# Patient Record
Sex: Female | Born: 1955 | Race: Black or African American | Hispanic: No | Marital: Married | Smoking: Current every day smoker
Health system: Southern US, Community
[De-identification: ages and names within clinical notes are randomized; demographics above are authoritative.]

## PROBLEM LIST (undated history)

## (undated) DIAGNOSIS — I10 Essential (primary) hypertension: Secondary | ICD-10-CM

## (undated) DIAGNOSIS — D649 Anemia, unspecified: Secondary | ICD-10-CM

## (undated) DIAGNOSIS — M539 Dorsopathy, unspecified: Secondary | ICD-10-CM

## (undated) DIAGNOSIS — G43909 Migraine, unspecified, not intractable, without status migrainosus: Secondary | ICD-10-CM

## (undated) DIAGNOSIS — E7800 Pure hypercholesterolemia, unspecified: Secondary | ICD-10-CM

## (undated) DIAGNOSIS — Z972 Presence of dental prosthetic device (complete) (partial): Secondary | ICD-10-CM

## (undated) DIAGNOSIS — M5136 Other intervertebral disc degeneration, lumbar region: Secondary | ICD-10-CM

## (undated) DIAGNOSIS — R519 Headache, unspecified: Secondary | ICD-10-CM

## (undated) DIAGNOSIS — R51 Headache: Secondary | ICD-10-CM

## (undated) DIAGNOSIS — M51369 Other intervertebral disc degeneration, lumbar region without mention of lumbar back pain or lower extremity pain: Secondary | ICD-10-CM

## (undated) DIAGNOSIS — M5126 Other intervertebral disc displacement, lumbar region: Secondary | ICD-10-CM

## (undated) DIAGNOSIS — M5030 Other cervical disc degeneration, unspecified cervical region: Secondary | ICD-10-CM

## (undated) HISTORY — PX: ENDOSCOPIC HEMILAMINOTOMY W/ DISCECTOMY LUMBAR: SUR439

## (undated) HISTORY — DX: Other cervical disc degeneration, unspecified cervical region: M50.30

## (undated) HISTORY — DX: Other intervertebral disc displacement, lumbar region: M51.26

## (undated) HISTORY — DX: Other intervertebral disc degeneration, lumbar region without mention of lumbar back pain or lower extremity pain: M51.369

## (undated) HISTORY — DX: Anemia, unspecified: D64.9

## (undated) HISTORY — DX: Migraine, unspecified, not intractable, without status migrainosus: G43.909

## (undated) HISTORY — DX: Other intervertebral disc degeneration, lumbar region: M51.36

---

## 2005-07-18 ENCOUNTER — Emergency Department: Payer: Self-pay | Admitting: Emergency Medicine

## 2005-07-18 ENCOUNTER — Other Ambulatory Visit: Payer: Self-pay

## 2007-09-10 ENCOUNTER — Ambulatory Visit: Payer: Self-pay | Admitting: Pain Medicine

## 2007-09-16 ENCOUNTER — Ambulatory Visit: Payer: Self-pay | Admitting: Pain Medicine

## 2007-09-30 ENCOUNTER — Encounter: Payer: Self-pay | Admitting: Pain Medicine

## 2007-10-06 ENCOUNTER — Ambulatory Visit: Payer: Self-pay | Admitting: Physician Assistant

## 2007-10-09 ENCOUNTER — Encounter: Payer: Self-pay | Admitting: Pain Medicine

## 2008-10-08 HISTORY — PX: ANTERIOR CERVICAL DISCECTOMY: SHX1160

## 2009-09-12 ENCOUNTER — Ambulatory Visit: Payer: Self-pay | Admitting: Family Medicine

## 2010-09-12 ENCOUNTER — Ambulatory Visit: Payer: Self-pay | Admitting: Internal Medicine

## 2010-09-25 ENCOUNTER — Ambulatory Visit: Payer: Self-pay | Admitting: Internal Medicine

## 2011-01-17 ENCOUNTER — Ambulatory Visit: Payer: Self-pay | Admitting: Internal Medicine

## 2011-03-21 ENCOUNTER — Ambulatory Visit: Payer: Self-pay | Admitting: Family Medicine

## 2011-03-31 ENCOUNTER — Ambulatory Visit: Payer: Self-pay | Admitting: Internal Medicine

## 2012-01-09 ENCOUNTER — Ambulatory Visit: Payer: Self-pay | Admitting: Internal Medicine

## 2012-11-03 ENCOUNTER — Ambulatory Visit: Payer: Self-pay

## 2012-11-03 LAB — RAPID INFLUENZA A&B ANTIGENS

## 2012-11-10 ENCOUNTER — Ambulatory Visit: Payer: Self-pay

## 2013-01-13 ENCOUNTER — Ambulatory Visit: Payer: Self-pay | Admitting: Internal Medicine

## 2013-04-07 ENCOUNTER — Ambulatory Visit: Payer: Self-pay | Admitting: Internal Medicine

## 2013-04-08 ENCOUNTER — Ambulatory Visit: Payer: Self-pay | Admitting: Internal Medicine

## 2013-04-08 LAB — CREATININE, SERUM
Creatinine: 1.33 mg/dL — ABNORMAL HIGH (ref 0.60–1.30)
EGFR (African American): 52 — ABNORMAL LOW
EGFR (Non-African Amer.): 45 — ABNORMAL LOW

## 2013-08-10 ENCOUNTER — Ambulatory Visit: Payer: Self-pay | Admitting: Physician Assistant

## 2014-06-06 ENCOUNTER — Ambulatory Visit: Payer: Self-pay | Admitting: Internal Medicine

## 2014-06-17 ENCOUNTER — Ambulatory Visit: Payer: Self-pay | Admitting: Internal Medicine

## 2014-06-29 ENCOUNTER — Ambulatory Visit: Payer: Self-pay | Admitting: Emergency Medicine

## 2014-08-06 ENCOUNTER — Ambulatory Visit: Payer: Self-pay | Admitting: Internal Medicine

## 2014-08-08 HISTORY — PX: BREAST BIOPSY: SHX20

## 2014-08-12 ENCOUNTER — Ambulatory Visit: Payer: Self-pay | Admitting: Internal Medicine

## 2014-10-20 ENCOUNTER — Ambulatory Visit: Payer: Self-pay | Admitting: Family Medicine

## 2015-01-31 LAB — SURGICAL PATHOLOGY

## 2015-03-23 ENCOUNTER — Encounter: Payer: Self-pay | Admitting: Internal Medicine

## 2015-03-23 ENCOUNTER — Other Ambulatory Visit: Payer: Self-pay | Admitting: Internal Medicine

## 2015-03-23 ENCOUNTER — Ambulatory Visit (INDEPENDENT_AMBULATORY_CARE_PROVIDER_SITE_OTHER): Payer: Managed Care, Other (non HMO) | Admitting: Internal Medicine

## 2015-03-23 VITALS — BP 164/84 | HR 72 | Temp 98.5°F | Ht 65.0 in | Wt 187.8 lb

## 2015-03-23 DIAGNOSIS — I8390 Asymptomatic varicose veins of unspecified lower extremity: Secondary | ICD-10-CM | POA: Insufficient documentation

## 2015-03-23 DIAGNOSIS — N6011 Diffuse cystic mastopathy of right breast: Secondary | ICD-10-CM

## 2015-03-23 DIAGNOSIS — D242 Benign neoplasm of left breast: Secondary | ICD-10-CM | POA: Insufficient documentation

## 2015-03-23 DIAGNOSIS — N951 Menopausal and female climacteric states: Secondary | ICD-10-CM | POA: Insufficient documentation

## 2015-03-23 DIAGNOSIS — F17200 Nicotine dependence, unspecified, uncomplicated: Secondary | ICD-10-CM | POA: Insufficient documentation

## 2015-03-23 DIAGNOSIS — K529 Noninfective gastroenteritis and colitis, unspecified: Secondary | ICD-10-CM

## 2015-03-23 DIAGNOSIS — G43009 Migraine without aura, not intractable, without status migrainosus: Secondary | ICD-10-CM | POA: Diagnosis not present

## 2015-03-23 DIAGNOSIS — I10 Essential (primary) hypertension: Secondary | ICD-10-CM | POA: Insufficient documentation

## 2015-03-23 DIAGNOSIS — N6012 Diffuse cystic mastopathy of left breast: Principal | ICD-10-CM

## 2015-03-23 DIAGNOSIS — K219 Gastro-esophageal reflux disease without esophagitis: Secondary | ICD-10-CM | POA: Insufficient documentation

## 2015-03-23 MED ORDER — BUTALBITAL-APAP-CAFFEINE 50-300-40 MG PO CAPS
1.0000 | ORAL_CAPSULE | Freq: Two times a day (BID) | ORAL | Status: DC | PRN
Start: 2015-03-23 — End: 2018-06-17

## 2015-03-23 MED ORDER — TOPIRAMATE 25 MG PO TABS: 50.0000 mg | ORAL_TABLET | Freq: Every day | ORAL | Status: DC

## 2015-03-23 MED ORDER — PROMETHAZINE HCL 25 MG PO TABS: 25.0000 mg | ORAL_TABLET | Freq: Three times a day (TID) | ORAL | Status: DC | PRN

## 2015-03-23 NOTE — Patient Instructions (Signed)
Take tylenol as needed for fever and chills Follow a low fat/bland diet until symtoms resolve Use phenergan as needed for nausea

## 2015-03-23 NOTE — Progress Notes (Signed)
Date:  03/23/2015   Name:  Jacqueline Harmon   DOB:  08-06-1956   MRN:  [id number]   Chief Complaint: Nausea; Emesis; Chills; and Fever   Review of Systems: Emesis  This is a new problem. The current episode started in the past 7 days. The problem occurs 2 to 4 times per day. The problem has been unchanged. The emesis has an appearance of stomach contents. The maximum temperature recorded prior to her arrival was 100.4 - 100.9 F. The fever has been present for 1 to 2 days. Associated symptoms include chills, diarrhea, a fever and headaches. Pertinent negatives include no abdominal pain, chest pain or coughing. She has tried increased fluids and diet change (and bepto bismol and advil) for the symptoms. The treatment provided mild relief.  Fever  This is a new problem. The current episode started in the past 7 days. The maximum temperature noted was 99 to 99.9 F. Associated symptoms include diarrhea, headaches, nausea and vomiting. Pertinent negatives include no abdominal pain, chest pain, coughing or urinary pain.    Review of Systems  Constitutional: Positive for fever, chills, activity change and fatigue. Negative for appetite change.  HENT: Negative for sinus pressure and trouble swallowing.   Respiratory: Negative for cough and shortness of breath.   Cardiovascular: Negative for chest pain.  Gastrointestinal: Positive for nausea, vomiting and diarrhea. Negative for abdominal pain and abdominal distention.  Genitourinary: Negative for dysuria.  Neurological: Positive for headaches. Negative for syncope.    Patient Active Problem List   Diagnosis Date Noted  . Smokes tobacco daily 03/23/2015  . Essential (primary) hypertension 03/23/2015  . Bilateral fibrocystic breast disease 03/23/2015  . Acid reflux 03/23/2015  . Hot flash, menopausal 03/23/2015  . Migraine without aura and responsive to treatment 03/23/2015  . 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 Authorizing Provider  cyclobenzaprine (FLEXERIL) 10 MG tablet Take 1 tablet by mouth at bedtime.   Yes Historical Provider, MD  metoprolol succinate (TOPROL XL) 100 MG 24 hr tablet Take 0.5 tablets by mouth daily. 01/19/15  Yes Historical Provider, MD  Butalbital-APAP-Caffeine 50-300-40 MG CAPS Take 1 capsule by mouth 2 (two) times daily as needed. migraine 03/23/13   Historical Provider, MD  Cholecalciferol (VITAMIN D3) 50000 UNITS CAPS Take 1 capsule by mouth once a week.    Historical Provider, MD  spironolactone (ALDACTONE) 50 MG tablet Take 1 tablet by mouth daily. 01/08/14   Historical Provider, MD  topiramate (TOPAMAX) 25 MG tablet Take 2 tablets by mouth at bedtime. 06/29/14   Historical Provider, MD    Allergies  Allergen Reactions  . Ace Inhibitors Cough  . Levofloxacin     myalgia, constipation    Past Surgical History  Procedure Laterality Date  . Breast biopsy Left     papilloma  . Breast mass excision Left 09/2014    History  Substance Use Topics  . Smoking status: Current Every Day Smoker  . Smokeless tobacco: Not on file  . Alcohol Use: 1.2 oz/week    2 Standard drinks or equivalent per week     Medication list has been reviewed and updated.  Physical Examination:  Physical Exam  Constitutional: She is oriented to person, place, and time. She appears well-developed. She appears lethargic. No distress.  Neck: Neck supple.  Cardiovascular: Normal rate, regular rhythm and normal heart sounds.   Pulmonary/Chest: Effort normal and breath sounds normal. She has no  wheezes. She has no rales.  Abdominal: Soft. Bowel sounds are normal. She exhibits no mass. There is no tenderness.  Lymphadenopathy:    She has no cervical adenopathy.  Neurological: She is oriented to person, place, and time. She appears lethargic.    BP 164/84 mmHg  Pulse 72  Temp(Src) 98.5 F (36.9 C)  Ht 5\' 5"  (1.651 m)  Wt 187 lb 12.8 oz (85.186 kg)   BMI 31.25 kg/m2  Assessment and Plan: 1. Gastroenteritis Take tylenol as needed for fever and chills - promethazine (PHENERGAN) 25 MG tablet; Take 1 tablet (25 mg total) by mouth every 8 (eight) hours as needed for nausea or vomiting.  Dispense: 20 tablet; Refill: 0  2. Migraine without aura and responsive to treatment Medication refilled - Butalbital-APAP-Caffeine 50-300-40 MG CAPS; Take 1 capsule by mouth 2 (two) times daily as needed. migraine  Dispense: 60 capsule; Refill: 5 - topiramate (TOPAMAX) 25 MG tablet; Take 2 tablets (50 mg total) by mouth at bedtime.  Dispense: 60 tablet; Refill: [place], MD [facility]  03/23/2015

## 2015-05-16 ENCOUNTER — Other Ambulatory Visit: Payer: Self-pay

## 2015-05-16 ENCOUNTER — Other Ambulatory Visit: Payer: Self-pay | Admitting: Internal Medicine

## 2015-05-16 DIAGNOSIS — G43009 Migraine without aura, not intractable, without status migrainosus: Secondary | ICD-10-CM

## 2015-05-16 MED ORDER — TOPIRAMATE 25 MG PO TABS: 50.0000 mg | ORAL_TABLET | Freq: Every day | ORAL | Status: DC

## 2015-06-09 ENCOUNTER — Other Ambulatory Visit: Payer: Self-pay | Admitting: Internal Medicine

## 2015-06-09 ENCOUNTER — Encounter: Payer: Self-pay | Admitting: Internal Medicine

## 2015-06-09 ENCOUNTER — Ambulatory Visit (INDEPENDENT_AMBULATORY_CARE_PROVIDER_SITE_OTHER): Payer: Managed Care, Other (non HMO) | Admitting: Internal Medicine

## 2015-06-09 VITALS — BP 142/80 | HR 84 | Ht 65.0 in | Wt 186.4 lb

## 2015-06-09 DIAGNOSIS — Z0000 Encounter for general adult medical examination without abnormal findings: Secondary | ICD-10-CM

## 2015-06-09 DIAGNOSIS — N6011 Diffuse cystic mastopathy of right breast: Secondary | ICD-10-CM | POA: Diagnosis not present

## 2015-06-09 DIAGNOSIS — I10 Essential (primary) hypertension: Secondary | ICD-10-CM | POA: Diagnosis not present

## 2015-06-09 DIAGNOSIS — G43009 Migraine without aura, not intractable, without status migrainosus: Secondary | ICD-10-CM

## 2015-06-09 DIAGNOSIS — K219 Gastro-esophageal reflux disease without esophagitis: Secondary | ICD-10-CM

## 2015-06-09 DIAGNOSIS — I8390 Asymptomatic varicose veins of unspecified lower extremity: Secondary | ICD-10-CM

## 2015-06-09 DIAGNOSIS — I868 Varicose veins of other specified sites: Secondary | ICD-10-CM | POA: Diagnosis not present

## 2015-06-09 DIAGNOSIS — N6012 Diffuse cystic mastopathy of left breast: Principal | ICD-10-CM

## 2015-06-09 LAB — POCT URINALYSIS DIPSTICK
Bilirubin, UA: NEGATIVE
Blood, UA: NEGATIVE
Glucose, UA: NEGATIVE
KETONES UA: NEGATIVE
Leukocytes, UA: NEGATIVE
Nitrite, UA: NEGATIVE
PH UA: 5
PROTEIN UA: NEGATIVE
Urobilinogen, UA: 0.2

## 2015-06-09 MED ORDER — SPIRONOLACTONE 50 MG PO TABS: 50.0000 mg | ORAL_TABLET | Freq: Every day | ORAL | Status: DC

## 2015-06-09 NOTE — Patient Instructions (Signed)

## 2015-06-09 NOTE — Progress Notes (Signed)
Date:  06/09/2015   Name:  Jacqueline Harmon   DOB:  26-Jan-1956   MRN:  [id number]   Chief Complaint: Annual Exam; Hypertension; and Headache Hypertension This is a chronic problem. The current episode started more than 1 year ago. The problem is unchanged. The problem is controlled. Associated symptoms include headaches. Pertinent negatives include no chest pain, palpitations or shortness of breath. There are no known risk factors for coronary artery disease. Past treatments include beta blockers. The current treatment provides significant improvement.  Headache  This is a recurrent problem. The problem occurs intermittently. The problem has been waxing and waning. The pain quality is similar to prior headaches. Pertinent negatives include no abdominal pain, back pain, coughing, dizziness, fever, hearing loss or numbness. Her past medical history is significant for hypertension.   Lower extremity pain. Patient has a history of varicose veins in both lower extremities. These are more symptomatic recently primarily in the upper calf and lower thigh. She has not tried any specific therapy such as elevation or compression stockings. She's not been referred to a vascular surgeon.  Reflux - patient continues to have intermittent reflux symptoms. She is reluctant to take Zantac or Prilosec on a daily basis. She is currently using Pepto-Bismol tablets as needed. In 2007 she had an EGD from Dr. Kathyrn Sheriff which showed gastritis but no other abnormal pathology. Of note at the same time she also had a normal colonoscopy.  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 She underwent biopsy of the left breast which showed a papilloma. Surgical excision was recommended that she chose watchful waiting instead. Will need a repeat diagnostic bilateral mammogram this fall.  Jacqueline Harmon is a 59 y.o. female who presents today for her Complete Annual Exam. She feels fairly well. She reports exercising  intermittently. She reports she is sleeping well. Mammogram is due in the fall.  Review of Systems:  Review of Systems  Constitutional: Negative for fever, chills and fatigue.  HENT: Negative for hearing loss.   Eyes: Negative for visual disturbance.  Respiratory: Negative for cough, chest tightness and shortness of breath.   Cardiovascular: Negative for chest pain, palpitations and leg swelling.  Gastrointestinal: Positive for diarrhea. Negative for abdominal pain and blood in stool.  Genitourinary: Negative for dysuria, urgency, hematuria, vaginal bleeding and pelvic pain.  Musculoskeletal: Negative for back pain, arthralgias and gait problem.       Bilateral lower extremity pain secondary to varicose veins  Neurological: Positive for headaches. Negative for dizziness, speech difficulty, light-headedness and numbness.  Psychiatric/Behavioral: Negative for dysphoric mood and decreased concentration.    Patient Active Problem List   Diagnosis Date Noted  . Smokes tobacco daily 03/23/2015  . Essential (primary) hypertension 03/23/2015  . Bilateral fibrocystic breast disease 03/23/2015  . Acid reflux 03/23/2015  . Hot flash, menopausal 03/23/2015  . Migraine without aura and responsive to treatment 03/23/2015  . 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 Authorizing Provider  Butalbital-APAP-Caffeine 50-300-40 MG CAPS Take 1 capsule by mouth 2 (two) times daily as needed. migraine 03/23/15  Yes Glean Hess, MD  cyclobenzaprine (FLEXERIL) 10 MG tablet Take 1 tablet by mouth at bedtime.   Yes Historical Provider, MD  metoprolol succinate (TOPROL XL) 100 MG 24 hr tablet Take 0.5 tablets by mouth daily. 01/19/15  Yes Historical Provider, MD  Multiple Vitamins-Minerals (HAIR/SKIN/NAILS/BIOTIN) TABS Take by mouth.   Yes Historical Provider,  MD  promethazine (PHENERGAN) 25 MG tablet Take 1 tablet (25 mg total) by mouth every 8  (eight) hours as needed for nausea or vomiting. 03/23/15  Yes Glean Hess, MD  topiramate (TOPAMAX) 25 MG tablet Take 2 tablets (50 mg total) by mouth at bedtime. 05/16/15  Yes Glean Hess, MD  vitamin B-12 (CYANOCOBALAMIN) 1000 MCG tablet Take 1,000 mcg by mouth 2 (two) times daily.   Yes Historical Provider, MD    Allergies  Allergen Reactions  . Ace Inhibitors Cough  . Levofloxacin     myalgia, constipation    Past Surgical History  Procedure Laterality Date  . Breast biopsy Left     papilloma  . Breast mass excision Left 09/2014    Social History  Substance Use Topics  . Smoking status: Current Every Day Smoker  . Smokeless tobacco: None  . Alcohol Use: 1.2 oz/week    2 Standard drinks or equivalent per week     Medication list has been reviewed and updated.  Physical Examination:  Physical Exam  Constitutional: She is oriented to person, place, and time. She appears well-developed. No distress.  HENT:  Head: Normocephalic and atraumatic.  Nose: Nose normal. Right sinus exhibits no maxillary sinus tenderness. Left sinus exhibits no maxillary sinus tenderness.  Mouth/Throat: Uvula is midline and oropharynx is clear and moist.  Eyes: Conjunctivae are normal. Right eye exhibits no discharge. Left eye exhibits no discharge. No scleral icterus.  Neck: Normal carotid pulses and no JVD present. No tracheal tenderness present. Carotid bruit is not present. No thyromegaly present.  Cardiovascular: Regular rhythm, S1 normal and normal pulses.   Pulmonary/Chest: Effort normal. No respiratory distress. Right breast exhibits no mass, no nipple discharge, no skin change and no tenderness. Left breast exhibits mass. Left breast exhibits no nipple discharge, no skin change and no tenderness.    Abdominal: Soft. Normal appearance and bowel sounds are normal. There is no hepatosplenomegaly. There is tenderness in the periumbilical area. There is no CVA tenderness.    Musculoskeletal: Normal range of motion. She exhibits no edema.  Lymphadenopathy:    She has no cervical adenopathy.    She has no axillary adenopathy.  Varicose veins noted bilateral posterior distal thighs-minimally tender.  Neurological: She is alert and oriented to person, place, and time. She has normal strength and normal reflexes.  Skin: Skin is warm and dry. No rash noted.  Psychiatric: She has a normal mood and affect. Her speech is normal and behavior is normal. Thought content normal.    BP 142/80 mmHg  Pulse 84  Ht 5\' 5"  (1.651 m)  Wt 186 lb 6.4 oz (84.55 kg)  BMI 31.02 kg/m2  Assessment and Plan: 1. Annual physical exam - POCT urinalysis dipstick - Vitamin D 1,25 dihydroxy  2. Essential (primary) hypertension Fairly well controlled recommend low-sodium diet and regular exercise - CBC with Differential/Platelet - Comprehensive metabolic panel - TSH - Lipid panel - spironolactone (ALDACTONE) 50 MG tablet; Take 1 tablet (50 mg total) by mouth daily.  Dispense: 30 tablet; Refill: 12  3. Migraine without aura and responsive to treatment Continue current therapy  4. Gastroesophageal reflux disease without esophagitis Recommend Zantac or Prilosec when necessary for reflux symptoms - CBC with Differential/Platelet  5. Bilateral fibrocystic breast disease Patient will be referred for bilateral diagnostic mammogram. Should consider excisional biopsy.  6. Phlebectasia Recommend compression stockings and regular exercise. Can refer to vascular surgery if needed.   Halina Maidens, MD [facility]  [facility]  06/09/2015

## 2015-06-10 ENCOUNTER — Encounter: Payer: Self-pay | Admitting: Internal Medicine

## 2015-06-10 DIAGNOSIS — E782 Mixed hyperlipidemia: Secondary | ICD-10-CM | POA: Insufficient documentation

## 2015-06-10 LAB — COMPREHENSIVE METABOLIC PANEL
A/G RATIO: 1.4 (ref 1.1–2.5)
ALT: 26 IU/L (ref 0–32)
AST: 21 IU/L (ref 0–40)
Albumin: 4.3 g/dL (ref 3.5–5.5)
Alkaline Phosphatase: 111 IU/L (ref 39–117)
BUN/Creatinine Ratio: 15 (ref 9–23)
BUN: 15 mg/dL (ref 6–24)
Bilirubin Total: <0.2 mg/dL (ref 0.0–1.2)
CALCIUM: 9.7 mg/dL (ref 8.7–10.2)
CO2: 24 mmol/L (ref 18–29)
Chloride: 102 mmol/L (ref 97–108)
Creatinine, Ser: 1.03 mg/dL — ABNORMAL HIGH (ref 0.57–1.00)
GFR, EST AFRICAN AMERICAN: 70 mL/min/{1.73_m2} (ref >59)
GFR, EST NON AFRICAN AMERICAN: 60 mL/min/{1.73_m2} (ref >59)
Globulin, Total: 3.1 g/dL (ref 1.5–4.5)
Glucose: 65 mg/dL (ref 65–99)
Potassium: 4.5 mmol/L (ref 3.5–5.2)
Sodium: 140 mmol/L (ref 134–144)
TOTAL PROTEIN: 7.4 g/dL (ref 6.0–8.5)

## 2015-06-10 LAB — CBC WITH DIFFERENTIAL/PLATELET
BASOS: 1 %
Basophils Absolute: 0.1 10*3/uL (ref 0.0–0.2)
EOS (ABSOLUTE): 0.2 10*3/uL (ref 0.0–0.4)
EOS: 2 %
Hematocrit: 41.5 % (ref 34.0–46.6)
Hemoglobin: 13.7 g/dL (ref 11.1–15.9)
IMMATURE GRANS (ABS): 0 10*3/uL (ref 0.0–0.1)
Immature Granulocytes: 0 %
LYMPHS: 35 %
Lymphocytes Absolute: 3.9 10*3/uL — ABNORMAL HIGH (ref 0.7–3.1)
MCH: 27.7 pg (ref 26.6–33.0)
MCHC: 33 g/dL (ref 31.5–35.7)
MCV: 84 fL (ref 79–97)
MONOS ABS: 0.9 10*3/uL (ref 0.1–0.9)
Monocytes: 8 %
NEUTROS PCT: 54 %
Neutrophils Absolute: 6 10*3/uL (ref 1.4–7.0)
PLATELETS: 420 10*3/uL — AB (ref 150–379)
RBC: 4.94 x10E6/uL (ref 3.77–5.28)
RDW: 15.4 % (ref 12.3–15.4)
WBC: 11.1 10*3/uL — AB (ref 3.4–10.8)

## 2015-06-10 LAB — LIPID PANEL
CHOL/HDL RATIO: 3.9 ratio (ref 0.0–4.4)
CHOLESTEROL TOTAL: 223 mg/dL — AB (ref 100–199)
HDL: 57 mg/dL (ref >39)
LDL CALC: 136 mg/dL — AB (ref 0–99)
TRIGLYCERIDES: 151 mg/dL — AB (ref 0–149)
VLDL CHOLESTEROL CAL: 30 mg/dL (ref 5–40)

## 2015-06-10 LAB — TSH: TSH: 1.46 u[IU]/mL (ref 0.450–4.500)

## 2015-06-14 ENCOUNTER — Other Ambulatory Visit: Payer: Self-pay | Admitting: Internal Medicine

## 2015-06-14 MED ORDER — ATORVASTATIN CALCIUM 10 MG PO TABS: 10.0000 mg | ORAL_TABLET | Freq: Every day | ORAL | Status: DC

## 2015-06-17 LAB — VITAMIN D 1,25 DIHYDROXY
VITAMIN D 1, 25 (OH) TOTAL: 59 pg/mL
VITAMIN D2 1, 25 (OH): 16 pg/mL
Vitamin D3 1, 25 (OH)2: 43 pg/mL

## 2015-08-08 ENCOUNTER — Other Ambulatory Visit: Payer: Self-pay

## 2015-08-08 DIAGNOSIS — I10 Essential (primary) hypertension: Secondary | ICD-10-CM

## 2015-08-08 MED ORDER — SPIRONOLACTONE 50 MG PO TABS: 50.0000 mg | ORAL_TABLET | Freq: Every day | ORAL | Status: DC

## 2015-08-08 MED ORDER — ATORVASTATIN CALCIUM 10 MG PO TABS: 10.0000 mg | ORAL_TABLET | Freq: Every day | ORAL | Status: DC

## 2015-08-09 ENCOUNTER — Ambulatory Visit
Admission: EM | Admit: 2015-08-09 | Discharge: 2015-08-09 | Disposition: A | Discharge status: Home / Self Care | Payer: Managed Care, Other (non HMO) | Attending: Family Medicine | Admitting: Family Medicine

## 2015-08-09 ENCOUNTER — Ambulatory Visit (INDEPENDENT_AMBULATORY_CARE_PROVIDER_SITE_OTHER): Payer: Managed Care, Other (non HMO)

## 2015-08-09 DIAGNOSIS — J40 Bronchitis, not specified as acute or chronic: Secondary | ICD-10-CM

## 2015-08-09 DIAGNOSIS — Z720 Tobacco use: Secondary | ICD-10-CM | POA: Diagnosis not present

## 2015-08-09 HISTORY — DX: Essential (primary) hypertension: I10

## 2015-08-09 HISTORY — DX: Pure hypercholesterolemia, unspecified: E78.00

## 2015-08-09 MED ORDER — ALBUTEROL SULFATE HFA 108 (90 BASE) MCG/ACT IN AERS: 1.0000 | INHALATION_SPRAY | Freq: Four times a day (QID) | RESPIRATORY_TRACT | Status: DC | PRN

## 2015-08-09 MED ORDER — HYDROCOD POLST-CPM POLST ER 10-8 MG/5ML PO SUER: 5.0000 mL | Freq: Every evening | ORAL | Status: DC | PRN

## 2015-08-09 MED ORDER — AZITHROMYCIN 250 MG PO TABS: ORAL_TABLET | ORAL | Status: DC

## 2015-08-09 NOTE — ED Notes (Signed)
Started 1 week ago with cough and runny nose. Now worsening productive yellow cough. Worse during the day.

## 2015-08-09 NOTE — ED Provider Notes (Signed)
CSN: [id number]     Arrival date & time 08/09/15  1606 History   First MD Initiated Contact with Patient 08/09/15 1728     Chief Complaint  Patient presents with  . URI   (Consider location/radiation/quality/duration/timing/severity/associated sxs/prior Treatment) HPI Comments: 59 yo female, smoker presents with a 7-10 days h/o productive cough, fevers, chills, wheezing and shortness of breath. Denies chest pains.   The history is provided by the patient.    Past Medical History  Diagnosis Date  . Hypertension   . Hypercholesteremia    Past Surgical History  Procedure Laterality Date  . Breast biopsy Left     papilloma   Family History  Problem Relation Age of Onset  . Breast cancer Mother   . Cancer Mother   . Diabetes Father   . Osteoarthritis Father    Social History  Substance Use Topics  . Smoking status: Current Every Day Smoker -- 0.50 packs/day    Types: Cigarettes  . Smokeless tobacco: None  . Alcohol Use: 1.2 oz/week    2 Standard drinks or equivalent per week   OB History    No data available     Review of Systems  Allergies  Ace inhibitors and Levofloxacin  Home Medications   Prior to Admission medications   Medication Sig Start Date End Date Taking? Authorizing Provider  atorvastatin (LIPITOR) 10 MG tablet Take 1 tablet (10 mg total) by mouth daily. 08/08/15  Yes Glean Hess, MD  metoprolol succinate (TOPROL XL) 100 MG 24 hr tablet Take 0.5 tablets by mouth daily. 01/19/15  Yes Historical Provider, MD  spironolactone (ALDACTONE) 50 MG tablet Take 1 tablet (50 mg total) by mouth daily. 08/08/15  Yes Glean Hess, MD  topiramate (TOPAMAX) 25 MG tablet Take 2 tablets (50 mg total) by mouth at bedtime. 05/16/15  Yes Glean Hess, MD  albuterol (PROVENTIL HFA;VENTOLIN HFA) 108 (90 BASE) MCG/ACT inhaler Inhale 1-2 puffs into the lungs every 6 (six) hours as needed for wheezing or shortness of breath. 08/09/15   Norval Gable, MD  azithromycin  (ZITHROMAX Z-PAK) 250 MG tablet 2 tabs po once day 1, then 1 tab po qd for next 4 days 08/09/15   Norval Gable, MD  Butalbital-APAP-Caffeine 50-300-40 MG CAPS Take 1 capsule by mouth 2 (two) times daily as needed. migraine 03/23/15   Glean Hess, MD  chlorpheniramine-HYDROcodone [facility] PENNKINETIC ER) 10-8 MG/5ML SUER Take 5 mLs by mouth at bedtime as needed for cough. 08/09/15   Norval Gable, MD  cyclobenzaprine (FLEXERIL) 10 MG tablet Take 1 tablet by mouth at bedtime.    Historical Provider, MD  Multiple Vitamins-Minerals (HAIR/SKIN/NAILS/BIOTIN) TABS Take by mouth.    Historical Provider, MD  promethazine (PHENERGAN) 25 MG tablet Take 1 tablet (25 mg total) by mouth every 8 (eight) hours as needed for nausea or vomiting. 03/23/15   Glean Hess, MD  vitamin B-12 (CYANOCOBALAMIN) 1000 MCG tablet Take 1,000 mcg by mouth 2 (two) times daily.    Historical Provider, MD   Meds Ordered and Administered this Visit  Medications - No data to display  BP 161/76 mmHg  Pulse 80  Temp(Src) 98 F (36.7 C) (Oral)  Resp 18  Ht 5\' 5"  (1.651 m)  Wt 180 lb (81.647 kg)  BMI 29.95 kg/m2  SpO2 100% No data found.   Physical Exam  Constitutional: She appears well-developed and well-nourished. No distress.  HENT:  Head: Normocephalic and atraumatic.  Right Ear: Tympanic membrane, external ear  and ear canal normal.  Left Ear: Tympanic membrane, external ear and ear canal normal.  Nose: No mucosal edema, rhinorrhea, nose lacerations, sinus tenderness, nasal deformity, septal deviation or nasal septal hematoma. No epistaxis.  No foreign bodies.  Mouth/Throat: Uvula is midline, oropharynx is clear and moist and mucous membranes are normal. No oropharyngeal exudate.  Eyes: Conjunctivae and EOM are normal. Pupils are equal, round, and reactive to light. Right eye exhibits no discharge. Left eye exhibits no discharge. No scleral icterus.  Neck: Normal range of motion. Neck supple. No thyromegaly  present.  Cardiovascular: Normal rate, regular rhythm and normal heart sounds.   Pulmonary/Chest: Effort normal. No respiratory distress. She has wheezes (few bilateral expiratory wheezes). She has no rales.  Diffuse rhonchi  Lymphadenopathy:    She has no cervical adenopathy.  Skin: She is not diaphoretic.  Nursing note and vitals reviewed.   ED Course  Procedures (including critical care time)  Labs Review Labs Reviewed - No data to display  Imaging Review Dg Chest 2 View  08/09/2015  CLINICAL DATA:  One-week history of cough.  Smoker. EXAM: CHEST  2 VIEW COMPARISON:  11/10/2012 FINDINGS: The cardiac silhouette, mediastinal and hilar contours are within normal limits. The lungs are clear. Mild hyperinflation. No pleural effusion. The bony thorax is intact. IMPRESSION: No acute cardiopulmonary findings.  Mild hyperinflation. Electronically Signed   By: Marijo Sanes M.D.   On: 08/09/2015 17:39     Visual Acuity Review  Right Eye Distance:   Left Eye Distance:   Bilateral Distance:    Right Eye Near:   Left Eye Near:    Bilateral Near:         MDM   1. Bronchitis   2. Tobacco abuse    Discharge Medication List as of 08/09/2015  5:52 PM    START taking these medications   Details  albuterol (PROVENTIL HFA;VENTOLIN HFA) 108 (90 BASE) MCG/ACT inhaler Inhale 1-2 puffs into the lungs every 6 (six) hours as needed for wheezing or shortness of breath., Starting 08/09/2015, Until Discontinued, Normal    azithromycin (ZITHROMAX Z-PAK) 250 MG tablet 2 tabs po once day 1, then 1 tab po qd for next 4 days, Normal    chlorpheniramine-HYDROcodone (TUSSIONEX PENNKINETIC ER) 10-8 MG/5ML SUER Take 5 mLs by mouth at bedtime as needed for cough., Starting 08/09/2015, Until Discontinued, Normal      1. Labs/x-ray results and diagnosis reviewed with patient 2. rx as per orders above; reviewed possible side effects, interactions, risks and benefits  3. Recommend supportive treatment with  increased fluids 4. Follow prn if symptoms worsen or don't improve    Norval Gable, MD 08/09/15 2127

## 2015-11-12 ENCOUNTER — Other Ambulatory Visit: Payer: Self-pay | Admitting: Internal Medicine

## 2016-01-30 ENCOUNTER — Encounter: Payer: Self-pay | Admitting: *Deleted

## 2016-01-30 ENCOUNTER — Ambulatory Visit
Admission: EM | Admit: 2016-01-30 | Discharge: 2016-01-30 | Disposition: A | Discharge status: Home / Self Care | Payer: Managed Care, Other (non HMO) | Attending: Family Medicine | Admitting: Family Medicine

## 2016-01-30 ENCOUNTER — Ambulatory Visit (INDEPENDENT_AMBULATORY_CARE_PROVIDER_SITE_OTHER): Payer: Managed Care, Other (non HMO)

## 2016-01-30 DIAGNOSIS — M778 Other enthesopathies, not elsewhere classified: Secondary | ICD-10-CM

## 2016-01-30 DIAGNOSIS — M779 Enthesopathy, unspecified: Secondary | ICD-10-CM

## 2016-01-30 NOTE — ED Notes (Addendum)
Patient was trying to thump her dog on the head with her left middle finger and missed 3 days ago. Patient felt a pop in her knuckle. Swelling is visible at base of left index finger.

## 2016-01-30 NOTE — Discharge Instructions (Signed)
Extensor Carpi Ulnaris Tendinitis With Rehab Tendonitis involves inflammation of a tendon, which is a soft tissue that connects muscle to bone. The Extensor Carpi Ulnaris (ECU) tendon is vulnerable to tendonitis. The ECU is responsible for straightening and rotating (abducting) the wrist. The ECU is important for gripping and pulling. ECU tendonitis may include inflammation of the ECU tendon lining (sheath). The tendon sheath usually secretes a fluid that allows the tendon to function smoothly, but when it becomes inflamed, function is impaired. This condition may also include a tear in the ECU tendon or muscle (strain). The strain may be classified as a grade 1 or 2 strain. Grade 1 strains cause pain, but the tendon is not lengthened. Grade 2 strains include a lengthened ligament, due to being stretched or partially ruptured. With grade 2 strains there is still function, although the function may be reduced. SYMPTOMS   Pain, tenderness, swelling, warmth, or redness on the little finger side of the wrist.  Pain that worsens when straightening the wrist or bending it toward the little finger.  Pain with gripping.  Limited motion of the wrist.  Crackling sound (crepitation) when the tendon or wrist is moved or touched. CAUSES  ECU tendonitis is caused by injury to the ECU tendon. It is usually due to chronic or repetitive injuries, but may be due to sharp (acute) injury. Common causes of injury include:  Strain from unusual use, overuse, or increase in activity, or change in activity of the wrist, hand, or forearm.  Direct hit (trauma) to the muscles and tendon on the side of the wrist.  Repetitive motions of the hand and wrist, due to friction of the tendon within the tendon lining. RISK INCREASES WITH:  Sports that involve repetitive hand and wrist motions (i.e. golfing, bowling).  Sports that require gripping (i.e. tennis, golf, weightlifting).  Heavy labor.  Poor wrist and forearm  strength and flexibility.  Failure to warm-up properly before activity. PREVENTION   Warm up and stretch properly before activity.  Allow the body to recover between activities.  Maintain physical fitness:  Strength, flexibility, and endurance.  Cardiovascular fitness.  Learn and use proper exercise technique. PROGNOSIS  If treated properly, ECU tendonitis is usually curable within 6 weeks.  RELATED COMPLICATIONS   Longer healing time, if not properly treated or if not given adequate time to heal.  Chronically inflamed tendon, causing persistent pain with activity. This may progress to constant pain, restriction of motion, and potentially tearing of the tendon.  Recurring symptoms, especially if activity is resumed too soon.  Risks of surgery: infection, bleeding, injury to nerves, continued pain, incomplete release of the tendon lining, recurring symptoms, cutting of the tendon, and weakness of the wrist and grip. TREATMENT  Treatment initially involves the use of ice and medicine, to help reduce pain and inflammation. Performing stretching and strengthening exercises regularly is important for a quick recovery. These exercises may be completed at home or with a therapist. Your caregiver may recommend the use of a brace or splint to reduce motions that aggravate symptoms. Corticosteroid injections may be recommended. If non-surgical treatment is unsuccessful, then surgery may be needed.  MEDICATION   If pain medicine is needed, nonsteroidal anti-inflammatory medicines (aspirin and ibuprofen), or other minor pain relievers (acetaminophen), are often recommended.  Do not take pain medicine for 7 days before surgery.  Prescription pain relievers may be given if your caregiver thinks they are needed. Use only as directed and only as much as you need.  Corticosteroid injections may be given to reduce inflammation. However, these injections should be reserved for serious cases, as  they may only be given a certain number of times. COLD THERAPY  Cold treatment (icing) relieves pain and reduces inflammation. Cold treatment should be applied for 10 to 15 minutes every 2 to 3 hours, and immediately after activity that aggravates your symptoms. Use ice packs or an ice massage. SEEK MEDICAL CARE IF:   Symptoms get worse or do not improve in 2 weeks, despite treatment.  You experience pain, numbness, or coldness in the hand.  Blue, gray, or dark color appears in the fingernails.  Any of the following occur after surgery: increased pain, swelling, redness, drainage of fluids, bleeding in the surgical area, or signs of infection.  New, unexplained symptoms develop. (Drugs used in treatment may produce side effects.) EXERCISES RANGE OF MOTION (ROM) AND STRETCHING EXERCISES - Extensor Carpi Ulnaris Tendinitis These are some of the initial exercises you may start your recovery program with, until you see your caregiver again or until your symptoms are resolved. Remember:  Flexible tissue is more tolerant of the stresses placed on it during activity.  Each stretch should be held for 20 to 30 seconds.  A gentle stretching sensation should be felt. RANGE OF MOTION - Wrist Flexion  Hold your right / left wrist with the fingers pointing down toward the floor.  Pull down on the wrist until you feel a stretch.  Hold this position for __________ seconds. Repeat exercise __________ times, __________ times per day.  This exercise should be done with the elbow: _____ Bent to 90 degrees. _____ Straight. RANGE OF MOTION - Wrist Flexion  Place the back of your right / left hand flat on the top of a table. Your shoulder should be turned in and your fingers facing away from your body.  Press down, bending your wrist and straightening your elbow until your feel a stretch.  Hold this position for __________ seconds.  Repeat exercise __________ times, __________ times per  day. STRENGTHENING EXERCISES - Extensor Carpi Ulnaris Tendinitis These are some of the initial exercises you may start your recovery program with, until you see your caregiver again or until your symptoms are resolved. Remember:  Strong muscles with good endurance tolerate stress better.  Do the exercises as initially prescribed by your caregiver. Progress slowly with each exercise, gradually increasing the number of repetitions and weight used, only as guided. RANGE OF MOTION - Wrist Extensors  Sit or stand with your forearm supported.  Using a __________ weight or a piece of rubber band or tubing, bend your wrist slowly upward toward you.  Hold this position for __________ seconds and then slowly lower the wrist back to the starting position.  Repeat exercise __________ times, __________ times per day. RANGE OF MOTION - Wrist, Ulnar Deviation  Stand with a hammer in your hand, or sit while holding onto a rubber band or tubing with both hands, and with your arm supported on a table.  Raise your hand with the hammer upward behind you, or pull down on the rubber tubing.  Hold this position for __________ seconds and then slowly lower the wrist back to the starting position.  Repeat exercise __________ times, __________ times per day. STRENGTH - Grip  Hold a wad of putty, soft modeling clay, a large sponge, a soft rubber ball or a soft tennis ball in your hand.  Squeeze as hard as you can.  Hold this position for  __________ seconds.  Repeat exercise __________ times, __________ times per day.   This information is not intended to replace advice given to you by your health care provider. Make sure you discuss any questions you have with your health care provider.   Document Released: 09/24/2005 Document Revised: 10/15/2014 Document Reviewed: 01/06/2009 Elsevier Interactive Patient Education [employer].

## 2016-01-30 NOTE — ED Provider Notes (Signed)
CSN: [id number]     Arrival date & time 01/30/16  1545 History   First MD Initiated Contact with Patient 01/30/16 1628     Chief Complaint  Patient presents with  . Extremity Pain   (Consider location/radiation/quality/duration/timing/severity/associated sxs/prior Treatment) HPI Comments: 60 yo female with a 3 day h/o left middle finger pain. States she was trying to flick her dog with her middle finger but missed and then felt a pop. Next day it was swollen over the knuckle and started getting red. Denies any fevers or chills.   Patient is a 60 y.o. female presenting with extremity pain. The history is provided by the patient.  Extremity Pain    Past Medical History  Diagnosis Date  . Hypertension   . Hypercholesteremia    Past Surgical History  Procedure Laterality Date  . Breast biopsy Left     papilloma   Family History  Problem Relation Age of Onset  . Breast cancer Mother   . Cancer Mother   . Diabetes Father   . Osteoarthritis Father    Social History  Substance Use Topics  . Smoking status: Current Every Day Smoker -- 0.50 packs/day    Types: Cigarettes  . Smokeless tobacco: Never Used  . Alcohol Use: 1.2 oz/week    2 Standard drinks or equivalent per week   OB History    No data available     Review of Systems  Allergies  Ace inhibitors and Levofloxacin  Home Medications   Prior to Admission medications   Medication Sig Start Date End Date Taking? Authorizing Provider  atorvastatin (LIPITOR) 10 MG tablet Take 1 tablet (10 mg total) by mouth daily. 08/08/15  Yes Glean Hess, MD  Butalbital-APAP-Caffeine 50-300-40 MG CAPS Take 1 capsule by mouth 2 (two) times daily as needed. migraine 03/23/15  Yes Glean Hess, MD  cyclobenzaprine (FLEXERIL) 10 MG tablet Take 1 tablet by mouth at bedtime.   Yes Historical Provider, MD  metoprolol succinate (TOPROL XL) 100 MG 24 hr tablet Take 0.5 tablets by mouth daily. 01/19/15  Yes Historical Provider, MD    Multiple Vitamins-Minerals (HAIR/SKIN/NAILS/BIOTIN) TABS Take by mouth.   Yes Historical Provider, MD  promethazine (PHENERGAN) 25 MG tablet Take 1 tablet (25 mg total) by mouth every 8 (eight) hours as needed for nausea or vomiting. 03/23/15  Yes Glean Hess, MD  spironolactone (ALDACTONE) 50 MG tablet Take 1 tablet (50 mg total) by mouth daily. 08/08/15  Yes Glean Hess, MD  topiramate (TOPAMAX) 25 MG tablet TAKE 2 TABLETS (50 MG TOTAL) BY MOUTH AT BEDTIME. 11/12/15  Yes Glean Hess, MD  albuterol (PROVENTIL HFA;VENTOLIN HFA) 108 (90 BASE) MCG/ACT inhaler Inhale 1-2 puffs into the lungs every 6 (six) hours as needed for wheezing or shortness of breath. 08/09/15   Norval Gable, MD  azithromycin (ZITHROMAX Z-PAK) 250 MG tablet 2 tabs po once day 1, then 1 tab po qd for next 4 days 08/09/15   Norval Gable, MD  chlorpheniramine-HYDROcodone [facility]) 10-8 MG/5ML SUER Take 5 mLs by mouth at bedtime as needed for cough. 08/09/15   Norval Gable, MD  vitamin B-12 (CYANOCOBALAMIN) 1000 MCG tablet Take 1,000 mcg by mouth 2 (two) times daily.    Historical Provider, MD   Meds Ordered and Administered this Visit  Medications - No data to display  BP 180/72 mmHg  Pulse 81  Temp(Src) 98.2 F (36.8 C) (Oral)  Resp 18  Ht 5\' 5"  (1.651 m)  Wt 189 lb (85.73 kg)  BMI 31.45 kg/m2  SpO2 100%  LMP  No data found.   Physical Exam  Constitutional: She appears well-developed and well-nourished. No distress.  Musculoskeletal:       Left hand: She exhibits tenderness, bony tenderness and swelling. She exhibits normal range of motion, normal two-point discrimination, normal capillary refill, no deformity and no laceration. Normal sensation noted. Normal strength noted.       Hands: Erythema and tenderness over the dorsal aspect of 3rd MCP joint  Skin: She is not diaphoretic.  Nursing note and vitals reviewed.   ED Course  Procedures (including critical care time)  Labs  Review Labs Reviewed - No data to display  Imaging Review Dg Finger Middle Left  01/30/2016  CLINICAL DATA:  Injury 4 days ago EXAM: LEFT MIDDLE FINGER 2+V COMPARISON:  None. FINDINGS: No acute fracture. No dislocation.  Unremarkable soft tissues. IMPRESSION: No acute bony pathology. Electronically Signed   By: Marybelle Killings M.D.   On: 01/30/2016 17:19     Visual Acuity Review  Right Eye Distance:   Left Eye Distance:   Bilateral Distance:    Right Eye Near:   Left Eye Near:    Bilateral Near:         MDM   1. Tendonitis of finger    (left middle finger)   1. x-ray results (negative) and diagnosis reviewed with patient 2. Recommend supportive treatment with rest, otc analgesics/NSAIDS, ice  3. Follow-up prn if symptoms worsen or don't improve    Norval Gable, MD 01/30/16 [phone]

## 2016-02-14 ENCOUNTER — Other Ambulatory Visit: Payer: Self-pay | Admitting: Internal Medicine

## 2016-02-25 ENCOUNTER — Other Ambulatory Visit: Payer: Self-pay | Admitting: Internal Medicine

## 2016-02-27 NOTE — Telephone Encounter (Signed)
Patient scheduled her CPE on 9/5 at 930 am

## 2016-04-04 ENCOUNTER — Ambulatory Visit
Admission: EM | Admit: 2016-04-04 | Discharge: 2016-04-04 | Disposition: A | Discharge status: Home / Self Care | Payer: Managed Care, Other (non HMO) | Attending: Family Medicine | Admitting: Family Medicine

## 2016-04-04 DIAGNOSIS — N390 Urinary tract infection, site not specified: Secondary | ICD-10-CM

## 2016-04-04 LAB — URINALYSIS COMPLETE WITH MICROSCOPIC (ARMC ONLY)
Bilirubin Urine: NEGATIVE
Glucose, UA: NEGATIVE mg/dL
Ketones, ur: NEGATIVE mg/dL
LEUKOCYTES UA: NEGATIVE
Nitrite: NEGATIVE
PH: 7 (ref 5.0–8.0)
Specific Gravity, Urine: 1.02 (ref 1.005–1.030)

## 2016-04-04 MED ORDER — NITROFURANTOIN MONOHYD MACRO 100 MG PO CAPS: 100.0000 mg | ORAL_CAPSULE | Freq: Two times a day (BID) | ORAL | Status: DC

## 2016-04-04 NOTE — ED Notes (Signed)
Patient complains of urinary frequency, urinary urgency and burning with urination. Patient states that she has also been having some low back pain, achiness and lower abdominal pain. Patient states that pain started 3 days ago and has been constantly worsening.

## 2016-04-04 NOTE — ED Provider Notes (Signed)
[facility]  ____________________________________________  Time seen: Approximately 5:44 PM  I have reviewed the triage vital signs and the nursing notes.  HISTORY   Chief Complaint Urinary Frequency  HPI Jacqueline Harmon is a 60 y.o. female presents for complaints of urinary frequency, urinary urgency, and burning with urination that has been gradual in onset over the last three days. Reports some bladder fullness and mild low back pain. Denies vaginal discharge, vaginal pain, vaginal odor or vaginal complaints. Reports continues to eat and drink well. Denies fevers.   Denies chest pain, shortness of breath, abdominal pain, bowel changes, abnormal stool or blood in stool, rash, extremity pain, extremity swelling, fall or injury.   No LMP recorded. Patient is postmenopausal.  Halina Maidens, MD: PCP   Past Medical History  Diagnosis Date  . Hypertension   . Hypercholesteremia     Patient Active Problem List   Diagnosis Date Noted  . Hyperlipidemia 06/10/2015  . Smokes tobacco daily 03/23/2015  . Essential (primary) hypertension 03/23/2015  . Bilateral fibrocystic breast disease 03/23/2015  . Acid reflux 03/23/2015  . Hot flash, menopausal 03/23/2015  . Migraine without aura and responsive to treatment 03/23/2015  . 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 Breast biopsy Left     papilloma    Current Outpatient Rx  Name  Route  Sig  Dispense  Refill  . atorvastatin (LIPITOR) 10 MG tablet      TAKE 1 TABLET (10 MG TOTAL) BY MOUTH DAILY.   90 tablet   1   .           .           . metoprolol succinate (TOPROL-XL) 100 MG 24 hr tablet      TAKE 1/2 TABLET DAILY   45 tablet   1   .           . spironolactone (ALDACTONE) 50 MG tablet   Oral   Take 1 tablet (50 mg total) by mouth daily.   90 tablet   1   . topiramate (TOPAMAX) 25 MG tablet      TAKE 2 TABLETS (50 MG TOTAL) BY MOUTH AT BEDTIME.   180 tablet    1   . albuterol (PROVENTIL HFA;VENTOLIN HFA) 108 (90 BASE) MCG/ACT inhaler   Inhalation   Inhale 1-2 puffs into the lungs every 6 (six) hours as needed for wheezing or shortness of breath.   1 Inhaler   0   .           .           . Multiple Vitamins-Minerals (HAIR/SKIN/NAILS/BIOTIN) TABS   Oral   Take by mouth.         .           .             Allergies Ace inhibitors; Levofloxacin; and Prednisone  Family History  Problem Relation Age of Onset  . Breast cancer Mother   . Cancer Mother   . Diabetes Father   . Osteoarthritis Father     Social History Social History  Substance Use Topics  . Smoking status: Current Every Day Smoker -- 0.50 packs/day    Types: Cigarettes  . Smokeless tobacco: Never Used  . Alcohol Use: 1.2 oz/week    2 Standard drinks or equivalent per week     Comment: occasionally    Review of Systems Constitutional:  No fever/chills Eyes: No visual changes. ENT: No sore throat. Cardiovascular: Denies chest pain. Respiratory: Denies shortness of breath. Gastrointestinal: No abdominal pain.  No nausea, no vomiting.  No diarrhea.  No constipation. Genitourinary: Positive for dysuria. Musculoskeletal: Negative for back pain. Skin: Negative for rash. Neurological: Negative for headaches, focal weakness or numbness.  10-point ROS otherwise negative.  ____________________________________________   PHYSICAL EXAM:  VITAL SIGNS: ED Triage Vitals  Enc Vitals Group     BP 04/04/16 1651 153/86 mmHg     Pulse Rate 04/04/16 1651 73     Resp 04/04/16 1651 16     Temp 04/04/16 1651 98 F (36.7 C)     Temp Source 04/04/16 1651 Oral     SpO2 04/04/16 1651 100 %     Weight 04/04/16 1651 186 lb (84.369 kg)     Height 04/04/16 1651 5\' 5"  (1.651 m)     Head Cir --      Peak Flow --      Pain Score 04/04/16 1651 2     Pain Loc --      Pain Edu? --      Excl. in [place]? --     Constitutional: Alert and oriented. Well appearing and in no acute  distress. Eyes: Conjunctivae are normal. PERRL. EOMI. Head: Atraumatic.  Ears: normal external appearance bilaterally.  Nose: No congestion/rhinnorhea.  Mouth/Throat: Mucous membranes are moist.  Oropharynx non-erythematous. Neck: No stridor.  No cervical spine tenderness to palpation. Hematological/Lymphatic/Immunilogical: No cervical lymphadenopathy. Cardiovascular: Normal rate, regular rhythm. Grossly normal heart sounds.  Good peripheral circulation. Respiratory: Normal respiratory effort.  No retractions. Lungs CTAB. No wheezes, rales or rhonchi.  Gastrointestinal: Soft and nontender. No distention. Normal Bowel sounds. No CVA tenderness. Musculoskeletal: No lower or upper extremity tenderness. Neurologic:  Normal speech and language. No gross focal neurologic deficits are appreciated. No gait instability. Skin:  Skin is warm, dry and intact. No rash noted. Psychiatric: Mood and affect are normal. Speech and behavior are normal.  ____________________________________________   LABS (all labs ordered are listed, but only abnormal results are displayed)  Labs Reviewed            URINALYSIS COMPLETEWITH MICROSCOPIC ([place]) - Abnormal; Notable for the following:    Hgb urine dipstick TRACE (*)    Protein, ur PRESENT (*)    Bacteria, UA MANY (*)    Squamous Epithelial / LPF 6-30 (*)    All other components within normal limits    INITIAL IMPRESSION / ASSESSMENT AND PLAN / ED COURSE  Pertinent labs & imaging results that were available during my care of the patient were reviewed by me and considered in my medical decision making (see chart for details).  Well appearing patient. No acute distress. Presenting for dysuria x 3 days. Denies other complaints. Abdomen soft and nontender, no CVA tenderness. Urine reviewed. Urine with many bacteria, 6-30 rbcs, protein, and trace hgb, suspect urinary tract infection. Will culture urine. Will treat with oral macrobid. Encourage rest,  fluids, and PCP follow up.   Discussed follow up with Primary care physician this week. Discussed follow up and return parameters including no resolution or any worsening concerns. Patient verbalized understanding and agreed to plan.   ____________________________________________   FINAL CLINICAL IMPRESSION(S) / ED DIAGNOSES  Final diagnoses:  UTI (lower urinary tract infection)     Discharge Medication List as of 04/04/2016  5:50 PM    START taking these medications   Details  nitrofurantoin, macrocrystal-monohydrate, (MACROBID)  100 MG capsule Take 1 capsule (100 mg total) by mouth 2 (two) times daily., Starting 04/04/2016, Until Discontinued, Normal        Note: This dictation was prepared with Dragon dictation along with smaller phrase technology. Any transcriptional errors that result from this process are unintentional.       Marylene Land, NP 04/12/16 1649  Marylene Land, NP 04/12/16 1651

## 2016-04-04 NOTE — Discharge Instructions (Signed)
Take medication as prescribed. Rest. Drink plenty of fluids.  ° °Follow up with your primary care physician this week as needed. Return to Urgent care for new or worsening concerns.  ° ° °Urinary Tract Infection °Urinary tract infections (UTIs) can develop anywhere along your urinary tract. Your urinary tract is your body's drainage system for removing wastes and extra water. Your urinary tract includes two kidneys, two ureters, a bladder, and a urethra. Your kidneys are a pair of bean-shaped organs. Each kidney is about the size of your fist. They are located below your ribs, one on each side of your spine. °CAUSES °Infections are caused by microbes, which are microscopic organisms, including fungi, viruses, and bacteria. These organisms are so small that they can only be seen through a microscope. Bacteria are the microbes that most commonly cause UTIs. °SYMPTOMS  °Symptoms of UTIs may vary by age and gender of the patient and by the location of the infection. Symptoms in young women typically include a frequent and intense urge to urinate and a painful, burning feeling in the bladder or urethra during urination. Older women and men are more likely to be tired, shaky, and weak and have muscle aches and abdominal pain. A fever may mean the infection is in your kidneys. Other symptoms of a kidney infection include pain in your back or sides below the ribs, nausea, and vomiting. °DIAGNOSIS °To diagnose a UTI, your caregiver will ask you about your symptoms. Your caregiver will also ask you to provide a urine sample. The urine sample will be tested for bacteria and white blood cells. White blood cells are made by your body to help fight infection. °TREATMENT  °Typically, UTIs can be treated with medication. Because most UTIs are caused by a bacterial infection, they usually can be treated with the use of antibiotics. The choice of antibiotic and length of treatment depend on your symptoms and the type of bacteria  causing your infection. °HOME CARE INSTRUCTIONS °· If you were prescribed antibiotics, take them exactly as your caregiver instructs you. Finish the medication even if you feel better after you have only taken some of the medication. °· Drink enough water and fluids to keep your urine clear or pale yellow. °· Avoid caffeine, tea, and carbonated beverages. They tend to irritate your bladder. °· Empty your bladder often. Avoid holding urine for long periods of time. °· Empty your bladder before and after sexual intercourse. °· After a bowel movement, women should cleanse from front to back. Use each tissue only once. °SEEK MEDICAL CARE IF:  °· You have back pain. °· You develop a fever. °· Your symptoms do not begin to resolve within 3 days. °SEEK IMMEDIATE MEDICAL CARE IF:  °· You have severe back pain or lower abdominal pain. °· You develop chills. °· You have nausea or vomiting. °· You have continued burning or discomfort with urination. °MAKE SURE YOU:  °· Understand these instructions. °· Will watch your condition. °· Will get help right away if you are not doing well or get worse. °  °This information is not intended to replace advice given to you by your health care provider. Make sure you discuss any questions you have with your health care provider. °  °Document Released: 07/04/2005 Document Revised: 06/15/2015 Document Reviewed: 11/02/2011 °Elsevier Interactive Patient Education ©2016 [employer] ° °

## 2016-04-06 LAB — URINE CULTURE: Special Requests: NORMAL

## 2016-05-21 ENCOUNTER — Other Ambulatory Visit: Payer: Self-pay | Admitting: Internal Medicine

## 2016-06-12 ENCOUNTER — Ambulatory Visit (INDEPENDENT_AMBULATORY_CARE_PROVIDER_SITE_OTHER): Payer: Managed Care, Other (non HMO) | Admitting: Internal Medicine

## 2016-06-12 ENCOUNTER — Encounter: Payer: Self-pay | Admitting: Internal Medicine

## 2016-06-12 ENCOUNTER — Other Ambulatory Visit: Payer: Self-pay | Admitting: Internal Medicine

## 2016-06-12 VITALS — BP 152/80 | HR 74 | Resp 16 | Ht 65.0 in | Wt 184.6 lb

## 2016-06-12 DIAGNOSIS — K219 Gastro-esophageal reflux disease without esophagitis: Secondary | ICD-10-CM

## 2016-06-12 DIAGNOSIS — N6011 Diffuse cystic mastopathy of right breast: Secondary | ICD-10-CM

## 2016-06-12 DIAGNOSIS — Z23 Encounter for immunization: Secondary | ICD-10-CM

## 2016-06-12 DIAGNOSIS — Z1159 Encounter for screening for other viral diseases: Secondary | ICD-10-CM

## 2016-06-12 DIAGNOSIS — E785 Hyperlipidemia, unspecified: Secondary | ICD-10-CM

## 2016-06-12 DIAGNOSIS — N6012 Diffuse cystic mastopathy of left breast: Secondary | ICD-10-CM | POA: Diagnosis not present

## 2016-06-12 DIAGNOSIS — I10 Essential (primary) hypertension: Secondary | ICD-10-CM

## 2016-06-12 DIAGNOSIS — G43009 Migraine without aura, not intractable, without status migrainosus: Secondary | ICD-10-CM

## 2016-06-12 DIAGNOSIS — Z0000 Encounter for general adult medical examination without abnormal findings: Secondary | ICD-10-CM

## 2016-06-12 DIAGNOSIS — Z720 Tobacco use: Secondary | ICD-10-CM | POA: Diagnosis not present

## 2016-06-12 DIAGNOSIS — F17200 Nicotine dependence, unspecified, uncomplicated: Secondary | ICD-10-CM

## 2016-06-12 DIAGNOSIS — E559 Vitamin D deficiency, unspecified: Secondary | ICD-10-CM

## 2016-06-12 LAB — POCT URINALYSIS DIPSTICK
Bilirubin, UA: NEGATIVE
GLUCOSE UA: NEGATIVE
Ketones, UA: NEGATIVE
LEUKOCYTES UA: NEGATIVE
Nitrite, UA: NEGATIVE
PROTEIN UA: NEGATIVE
RBC UA: NEGATIVE
Spec Grav, UA: 1.01
pH, UA: 6.5

## 2016-06-12 MED ORDER — IRBESARTAN 150 MG PO TABS: 150.0000 mg | ORAL_TABLET | Freq: Every day | ORAL | 1 refills | Status: DC

## 2016-06-12 MED ORDER — SPIRONOLACTONE 50 MG PO TABS: 50.0000 mg | ORAL_TABLET | Freq: Every day | ORAL | 1 refills | Status: DC

## 2016-06-12 NOTE — Progress Notes (Signed)
Date:  06/12/2016   Name:  Jacqueline Harmon   DOB:  May 10, 1956   MRN:  [id number]   Chief Complaint: Annual Exam (PAP and mammogram please order Mammo for [facility]. ) BETHANI MUFFLEY is a 60 y.o. female who presents today for her Complete Annual Exam. She feels well. She reports exercising regularly. She is training for a 5K in the spring. She reports she is sleeping well. She is due for a pap smear today - last done 2014 (normal thin prep).  Hypertension  This is a chronic problem. The current episode started more than 1 year ago. The problem is unchanged. The problem is controlled. Pertinent negatives include no chest pain, headaches, palpitations or shortness of breath. The current treatment provides moderate improvement. Compliance problems include medication side effects (concerned that thinning hair is from metoprolol).   Gastroesophageal Reflux  She complains of heartburn. She reports no abdominal pain, no chest pain, no coughing, no sore throat, no tooth decay, no water brash or no wheezing. This is a recurrent problem. The current episode started more than 1 year ago. The problem occurs occasionally. Pertinent negatives include no fatigue. Risk factors include smoking/tobacco exposure. She has tried an antacid for the symptoms.  Migraine   The current episode started more than 1 year ago. The problem has been resolved (no headache in a long time). Pertinent negatives include no abdominal pain, coughing, dizziness, fever, hearing loss, sore throat or tinnitus. Her past medical history is significant for hypertension.   Fibrocystic Breast - had mammogram 07/2014 followed by excision of breast papilloma.  No follow up mammogram scheduled.  She would like to go to [facility].   Review of Systems  Constitutional: Negative for chills, fatigue and fever.  HENT: Negative for congestion, hearing loss, sore throat, tinnitus, trouble swallowing and voice change.   Eyes: Negative for visual  disturbance.  Respiratory: Negative for cough, chest tightness, shortness of breath and wheezing.   Cardiovascular: Negative for chest pain, palpitations and leg swelling.  Gastrointestinal: Positive for heartburn. Negative for abdominal pain, constipation and diarrhea.  Endocrine: Negative for polydipsia and polyuria.  Genitourinary: Negative for dysuria, frequency, genital sores, vaginal bleeding and vaginal discharge.  Musculoskeletal: Negative for arthralgias, gait problem and joint swelling.  Skin: Negative for color change and rash.  Neurological: Negative for dizziness, tremors, light-headedness and headaches.  Hematological: Negative for adenopathy. Does not bruise/bleed easily.  Psychiatric/Behavioral: Negative for dysphoric mood and sleep disturbance. The patient is not nervous/anxious.     Patient Active Problem List   Diagnosis Date Noted  . Hyperlipidemia 06/10/2015  . Smokes tobacco daily 03/23/2015  . Essential (primary) hypertension 03/23/2015  . Bilateral fibrocystic breast disease 03/23/2015  . Acid reflux 03/23/2015  . Hot flash, menopausal 03/23/2015  . Migraine without aura and responsive to treatment 03/23/2015  . 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 Authorizing Provider  atorvastatin (LIPITOR) 10 MG tablet TAKE 1 TABLET (10 MG TOTAL) BY MOUTH DAILY. 02/25/16   Glean Hess, MD  Butalbital-APAP-Caffeine 50-300-40 MG CAPS Take 1 capsule by mouth 2 (two) times daily as needed. migraine 03/23/15   Glean Hess, MD  cyclobenzaprine (FLEXERIL) 10 MG tablet Take 1 tablet by mouth at bedtime.    Historical Provider, MD  metoprolol succinate (TOPROL-XL) 100 MG 24 hr tablet TAKE 1/2 TABLET DAILY 02/14/16   Glean Hess, MD  Multiple Vitamins-Minerals (HAIR/SKIN/NAILS/BIOTIN) TABS Take  by mouth.    Historical Provider, MD  spironolactone (ALDACTONE) 50 MG tablet Take 1 tablet (50 mg total) by mouth  daily. 08/08/15   Glean Hess, MD  topiramate (TOPAMAX) 25 MG tablet TAKE 2 TABLETS (50 MG TOTAL) BY MOUTH AT BEDTIME. 05/21/16   Glean Hess, MD  vitamin B-12 (CYANOCOBALAMIN) 1000 MCG tablet Take 1,000 mcg by mouth 2 (two) times daily.    Historical Provider, MD    Allergies  Allergen Reactions  . Ace Inhibitors Cough  . Levofloxacin     myalgia, constipation  . Prednisone Itching    Past Surgical History:  Procedure Laterality Date  . BREAST BIOPSY Left 08/2014   papilloma    Social History  Substance Use Topics  . Smoking status: Current Every Day Smoker    Packs/day: 0.50    Types: Cigarettes  . Smokeless tobacco: Never Used  . Alcohol use 1.2 oz/week    2 Standard drinks or equivalent per week     Comment: occasionally     Medication list has been reviewed and updated.   Physical Exam  Constitutional: She is oriented to person, place, and time. She appears well-developed and well-nourished. No distress.  HENT:  Head: Normocephalic and atraumatic.  Right Ear: Tympanic membrane and ear canal normal.  Left Ear: Tympanic membrane and ear canal normal.  Nose: Right sinus exhibits no maxillary sinus tenderness. Left sinus exhibits no maxillary sinus tenderness.  Mouth/Throat: Uvula is midline and oropharynx is clear and moist.  Eyes: Conjunctivae and EOM are normal. Right eye exhibits no discharge. Left eye exhibits no discharge. No scleral icterus.  Neck: Normal range of motion. Carotid bruit is not present. No erythema present. No thyromegaly present.  Cardiovascular: Normal rate, regular rhythm, normal heart sounds and normal pulses.   Pulmonary/Chest: Effort normal and breath sounds normal. No respiratory distress. She has no wheezes. Right breast exhibits no mass, no nipple discharge, no skin change and no tenderness. Left breast exhibits no mass, no nipple discharge, no skin change and no tenderness.  Abdominal: Soft. Bowel sounds are normal. There is no  hepatosplenomegaly. There is no tenderness. There is no CVA tenderness.  Genitourinary: Rectum normal, vagina normal and uterus normal. There is no tenderness, lesion or injury on the right labia. There is no tenderness, lesion or injury on the left labia. Cervix exhibits no motion tenderness, no discharge and no friability. Right adnexum displays no mass, no tenderness and no fullness. Left adnexum displays no mass, no tenderness and no fullness.  Musculoskeletal: Normal range of motion.  Lymphadenopathy:    She has no cervical adenopathy.    She has no axillary adenopathy.  Neurological: She is alert and oriented to person, place, and time. She has normal reflexes. No cranial nerve deficit or sensory deficit.  Skin: Skin is warm, dry and intact. No rash noted.  Psychiatric: She has a normal mood and affect. Her speech is normal and behavior is normal. Thought content normal.  Nursing note and vitals reviewed.   BP (!) 152/80 (BP Location: Right Arm, Patient Position: Sitting, Cuff Size: Normal)   Pulse 74   Resp 16   Ht 5\' 5"  (1.651 m)   Wt 184 lb 9.6 oz (83.7 kg)   SpO2 98%   BMI 30.72 kg/m   Assessment and Plan: 1. Annual physical exam - Pap IG (Image Guided) - Ambulatory referral to Gastroenterology  2. Essential (primary) hypertension Stop metoprolol due to hair thinning and begin Avapro -  Comprehensive metabolic panel - TSH - POCT urinalysis dipstick - spironolactone (ALDACTONE) 50 MG tablet; Take 1 tablet (50 mg total) by mouth daily.  Dispense: 90 tablet; Refill: 1 - irbesartan (AVAPRO) 150 MG tablet; Take 1 tablet (150 mg total) by mouth daily.  Dispense: 90 tablet; Refill: 1  3. Migraine without aura and responsive to treatment No recent headaches  4. Gastroesophageal reflux disease without esophagitis Stable on PRN antacids - CBC with Differential/Platelet  5. Hyperlipidemia On statin therapy - Lipid panel  6. Bilateral fibrocystic breast disease Overdue for  follow up mammogram Will give order for [facility]  7. Vitamin D deficiency Continue supplement otc - VITAMIN D 25 Hydroxy (Vit-D Deficiency, Fractures)  8. Need for hepatitis C screening test - Hepatitis C antibody  9. Smokes tobacco daily - Nurse to provide smoking / tobacco cessation education  10. Need for pneumococcal vaccination - Pneumococcal polysaccharide vaccine 23-valent greater than or equal to 2yo subcutaneous/IM   Halina Maidens, MD [facility]  06/12/2016

## 2016-06-12 NOTE — Patient Instructions (Addendum)
Pneumococcal Polysaccharide Vaccine: What You Need to Know  1. Why get vaccinated?  Vaccination can protect older adults (and some children and younger adults) from pneumococcal disease.  Pneumococcal disease is caused by bacteria that can spread from person to person through close contact. It can cause ear infections, and it can also lead to more serious infections of the:   · Lungs (pneumonia),  · Blood (bacteremia), and  · Covering of the brain and spinal cord (meningitis). Meningitis can cause deafness and brain damage, and it can be fatal.  Anyone can get pneumococcal disease, but children under 2 years of age, people with certain medical conditions, adults over 65 years of age, and cigarette smokers are at the highest risk.  About 18,000 older adults die each year from pneumococcal disease in the [place].  Treatment of pneumococcal infections with penicillin and other drugs used to be more effective. But some strains of the disease have become resistant to these drugs. This makes prevention of the disease, through vaccination, even more important.  2. Pneumococcal polysaccharide vaccine (PPSV23)  Pneumococcal polysaccharide vaccine (PPSV23) protects against 23 types of pneumococcal bacteria. It will not prevent all pneumococcal disease.  PPSV23 is recommended for:  · All adults 65 years of age and older,  · Anyone 2 through 59 years of age with certain long-term health problems,  · Anyone 2 through 59 years of age with a weakened immune system,  · Adults 19 through 59 years of age who smoke cigarettes or have asthma.  Most people need only one dose of PPSV. A second dose is recommended for certain high-risk groups. People 65 and older should get a dose even if they have gotten one or more doses of the vaccine before they turned 65.  Your healthcare provider can give you more information about these recommendations.  Most healthy adults develop protection within 2 to 3 weeks of getting the shot.  3. Some  people should not get this vaccine  · Anyone who has had a life-threatening allergic reaction to PPSV should not get another dose.  · Anyone who has a severe allergy to any component of PPSV should not receive it. Tell your provider if you have any severe allergies.  · Anyone who is moderately or severely ill when the shot is scheduled may be asked to wait until they recover before getting the vaccine. Someone with a mild illness can usually be vaccinated.  · Children less than 2 years of age should not receive this vaccine.  · There is no evidence that PPSV is harmful to either a pregnant woman or to her fetus. However, as a precaution, women who need the vaccine should be vaccinated before becoming pregnant, if possible.  4. Risks of a vaccine reaction  With any medicine, including vaccines, there is a chance of side effects. These are usually mild and go away on their own, but serious reactions are also possible.  About half of people who get PPSV have mild side effects, such as redness or pain where the shot is given, which go away within about two days.  Less than 1 out of 100 people develop a fever, muscle aches, or more severe local reactions.  Problems that could happen after any vaccine:  · People sometimes faint after a medical procedure, including vaccination. Sitting or lying down for about 15 minutes can help prevent fainting, and injuries caused by a fall. Tell your doctor if you feel dizzy, or have vision changes or   ringing in the ears.  · Some people get severe pain in the shoulder and have difficulty moving the arm where a shot was given. This happens very rarely.  · Any medication can cause a severe allergic reaction. Such reactions from a vaccine are very rare, estimated at about 1 in a million doses, and would happen within a few minutes to a few hours after the vaccination.  As with any medicine, there is a very remote chance of a vaccine causing a serious injury or death.  The safety of  vaccines is always being monitored. For more information, visit: www.cdc.gov/vaccinesafety/  5. What if there is a serious reaction?  What should I look for?  Look for anything that concerns you, such as signs of a severe allergic reaction, very high fever, or unusual behavior.   Signs of a severe allergic reaction can include hives, swelling of the face and throat, difficulty breathing, a fast heartbeat, dizziness, and weakness. These would usually start a few minutes to a few hours after the vaccination.  What should I do?  If you think it is a severe allergic reaction or other emergency that can't wait, call 9-1-1 or get to the nearest hospital. Otherwise, call your doctor.  Afterward, the reaction should be reported to the Vaccine Adverse Event Reporting System (VAERS). Your doctor might file this report, or you can do it yourself through the VAERS web site at www.vaers.hhs.gov, or by calling 1-800-822-7967.   VAERS does not give medical advice.  6. How can I learn more?  · Ask your doctor. He or she can give you the vaccine package insert or suggest other sources of information.  · Call your local or state health department.  · Contact the Centers for Disease Control and Prevention (CDC):    Call 1-800-232-4636 (1-800-CDC-INFO) or    Visit CDC's website at www.cdc.gov/vaccines  CDC Pneumococcal Polysaccharide Vaccine VIS (01/29/14)     This information is not intended to replace advice given to you by your health care provider. Make sure you discuss any questions you have with your health care provider.     Document Released: 07/22/2006 Document Revised: 10/15/2014 Document Reviewed: 02/01/2014  Elsevier Interactive Patient Education ©2016 Elsevier Inc.

## 2016-06-13 LAB — HEPATITIS C ANTIBODY: HEP C VIRUS AB: 0.7 {s_co_ratio} (ref 0.0–0.9)

## 2016-06-13 LAB — COMPREHENSIVE METABOLIC PANEL
ALBUMIN: 4.5 g/dL (ref 3.5–5.5)
ALK PHOS: 91 IU/L (ref 39–117)
ALT: 24 IU/L (ref 0–32)
AST: 27 IU/L (ref 0–40)
Albumin/Globulin Ratio: 1.6 (ref 1.2–2.2)
BUN / CREAT RATIO: 9 (ref 9–23)
BUN: 11 mg/dL (ref 6–24)
CHLORIDE: 103 mmol/L (ref 96–106)
CO2: 24 mmol/L (ref 18–29)
Calcium: 9.5 mg/dL (ref 8.7–10.2)
Creatinine, Ser: 1.16 mg/dL — ABNORMAL HIGH (ref 0.57–1.00)
GFR calc Af Amer: 60 mL/min/{1.73_m2} (ref >59)
GFR calc non Af Amer: 52 mL/min/{1.73_m2} — ABNORMAL LOW (ref >59)
GLOBULIN, TOTAL: 2.8 g/dL (ref 1.5–4.5)
Glucose: 76 mg/dL (ref 65–99)
Potassium: 5 mmol/L (ref 3.5–5.2)
SODIUM: 141 mmol/L (ref 134–144)
Total Protein: 7.3 g/dL (ref 6.0–8.5)

## 2016-06-13 LAB — CBC WITH DIFFERENTIAL/PLATELET
BASOS ABS: 0 10*3/uL (ref 0.0–0.2)
Basos: 0 %
EOS (ABSOLUTE): 0.1 10*3/uL (ref 0.0–0.4)
EOS: 1 %
HEMATOCRIT: 41.4 % (ref 34.0–46.6)
HEMOGLOBIN: 13.1 g/dL (ref 11.1–15.9)
Immature Grans (Abs): 0 10*3/uL (ref 0.0–0.1)
Immature Granulocytes: 0 %
LYMPHS ABS: 3.6 10*3/uL — AB (ref 0.7–3.1)
Lymphs: 37 %
MCH: 28.2 pg (ref 26.6–33.0)
MCHC: 31.6 g/dL (ref 31.5–35.7)
MCV: 89 fL (ref 79–97)
MONOCYTES: 9 %
Monocytes Absolute: 0.8 10*3/uL (ref 0.1–0.9)
NEUTROS ABS: 5.1 10*3/uL (ref 1.4–7.0)
Neutrophils: 53 %
Platelets: 403 10*3/uL — ABNORMAL HIGH (ref 150–379)
RBC: 4.64 x10E6/uL (ref 3.77–5.28)
RDW: 15.4 % (ref 12.3–15.4)
WBC: 9.7 10*3/uL (ref 3.4–10.8)

## 2016-06-13 LAB — LIPID PANEL
CHOLESTEROL TOTAL: 167 mg/dL (ref 100–199)
Chol/HDL Ratio: 2.9 ratio units (ref 0.0–4.4)
HDL: 58 mg/dL (ref >39)
LDL Calculated: 87 mg/dL (ref 0–99)
Triglycerides: 110 mg/dL (ref 0–149)
VLDL Cholesterol Cal: 22 mg/dL (ref 5–40)

## 2016-06-13 LAB — VITAMIN D 25 HYDROXY (VIT D DEFICIENCY, FRACTURES): VIT D 25 HYDROXY: 32.6 ng/mL (ref 30.0–100.0)

## 2016-06-13 LAB — TSH: TSH: 1.14 u[IU]/mL (ref 0.450–4.500)

## 2016-06-14 LAB — PAP IG (IMAGE GUIDED): PAP Smear Comment: 0

## 2016-06-28 ENCOUNTER — Telehealth: Payer: Self-pay

## 2016-06-28 NOTE — Telephone Encounter (Signed)
Patient needs to be scheduled for a Screening Colonoscopy.   Called patient x2 and sent out a notification.   Please update referral once appointment has been made

## 2016-07-02 ENCOUNTER — Telehealth: Payer: Self-pay

## 2016-07-02 NOTE — Telephone Encounter (Signed)
It could be related but it is not a common side effect.  Stop the new medication and see if the vomiting resolves.  If it does, call for another medication.  If not, then it is not the BP medication.

## 2016-07-02 NOTE — Telephone Encounter (Signed)
Patient vomiting since Demetrius Charity could this be from new Rx? Started BP med last week. Please advise/

## 2016-07-16 ENCOUNTER — Telehealth: Payer: Self-pay | Admitting: Gastroenterology

## 2016-07-16 ENCOUNTER — Other Ambulatory Visit: Payer: Self-pay

## 2016-07-16 NOTE — Telephone Encounter (Signed)
EGD/Colonoscopy before bariatric surgery

## 2016-07-16 NOTE — Telephone Encounter (Signed)
EGD Dysphagia R13.10 Screening Colonoscopy Z12.11 [facility] [id number] [employer] Pre cert is not required

## 2016-07-16 NOTE — Telephone Encounter (Signed)
Gastroenterology Pre-Procedure Review  Request Date: 08/02/2016 Requesting Physician: Dr. Army Melia  PATIENT REVIEW QUESTIONS: The patient responded to the following health history questions as indicated:    1. Are you having any GI issues? yes (Diarrhea) 2. Do you have a personal history of Polyps? no 3. Do you have a family history of Colon Cancer or Polyps? no 4. Diabetes Mellitus? no 5. Joint replacements in the past 12 months?no 6. Major health problems in the past 3 months?no 7. Any artificial heart valves, MVP, or defibrillator?no    MEDICATIONS & ALLERGIES:    Patient reports the following regarding taking any anticoagulation/antiplatelet therapy:   Plavix, Coumadin, Eliquis, Xarelto, Lovenox, Pradaxa, Brilinta, or Effient? no Aspirin? no  Patient confirms/reports the following medications:  Current Outpatient Prescriptions  Medication Sig Dispense Refill  . atorvastatin (LIPITOR) 10 MG tablet TAKE 1 TABLET (10 MG TOTAL) BY MOUTH DAILY. 90 tablet 1  . Butalbital-APAP-Caffeine 50-300-40 MG CAPS Take 1 capsule by mouth 2 (two) times daily as needed. migraine 60 capsule 5  . cyclobenzaprine (FLEXERIL) 10 MG tablet Take 1 tablet by mouth at bedtime.    . irbesartan (AVAPRO) 150 MG tablet Take 1 tablet (150 mg total) by mouth daily. 90 tablet 1  . Multiple Vitamins-Minerals (HAIR/SKIN/NAILS/BIOTIN) TABS Take by mouth.    . spironolactone (ALDACTONE) 50 MG tablet Take 1 tablet (50 mg total) by mouth daily. 90 tablet 1  . topiramate (TOPAMAX) 25 MG tablet TAKE 2 TABLETS (50 MG TOTAL) BY MOUTH AT BEDTIME. 180 tablet 1  . vitamin B-12 (CYANOCOBALAMIN) 1000 MCG tablet Take 1,000 mcg by mouth 2 (two) times daily.     No current facility-administered medications for this visit.     Patient confirms/reports the following allergies:  Allergies  Allergen Reactions  . Ace Inhibitors Cough  . Levofloxacin     myalgia, constipation  . Prednisone Itching    No orders of the defined  types were placed in this encounter.   AUTHORIZATION INFORMATION Primary Insurance: 1D#: Group #:  Secondary Insurance: 1D#: Group #:  SCHEDULE INFORMATION: Date: 08/02/2016 Time: Location: [facility]

## 2016-07-25 ENCOUNTER — Encounter: Payer: Self-pay | Admitting: *Deleted

## 2016-08-01 NOTE — Discharge Instructions (Signed)

## 2016-08-02 ENCOUNTER — Ambulatory Visit
Admission: RE | Admit: 2016-08-02 | Discharge: 2016-08-02 | Disposition: A | Discharge status: Home / Self Care | Payer: Managed Care, Other (non HMO) | Source: Ambulatory Visit | Attending: Gastroenterology | Admitting: Gastroenterology

## 2016-08-02 ENCOUNTER — Ambulatory Visit: Payer: Managed Care, Other (non HMO) | Admitting: Anesthesiology

## 2016-08-02 ENCOUNTER — Encounter
Admission: RE | Disposition: A | Discharge status: Home / Self Care | Payer: Self-pay | Source: Ambulatory Visit | Attending: Gastroenterology

## 2016-08-02 DIAGNOSIS — D126 Benign neoplasm of colon, unspecified: Secondary | ICD-10-CM

## 2016-08-02 DIAGNOSIS — K219 Gastro-esophageal reflux disease without esophagitis: Secondary | ICD-10-CM | POA: Diagnosis not present

## 2016-08-02 DIAGNOSIS — K648 Other hemorrhoids: Secondary | ICD-10-CM | POA: Insufficient documentation

## 2016-08-02 DIAGNOSIS — Z1211 Encounter for screening for malignant neoplasm of colon: Secondary | ICD-10-CM

## 2016-08-02 DIAGNOSIS — D123 Benign neoplasm of transverse colon: Secondary | ICD-10-CM

## 2016-08-02 DIAGNOSIS — R112 Nausea with vomiting, unspecified: Secondary | ICD-10-CM

## 2016-08-02 DIAGNOSIS — F17210 Nicotine dependence, cigarettes, uncomplicated: Secondary | ICD-10-CM | POA: Diagnosis not present

## 2016-08-02 DIAGNOSIS — D122 Benign neoplasm of ascending colon: Secondary | ICD-10-CM | POA: Diagnosis not present

## 2016-08-02 DIAGNOSIS — K222 Esophageal obstruction: Secondary | ICD-10-CM | POA: Diagnosis not present

## 2016-08-02 DIAGNOSIS — E7800 Pure hypercholesterolemia, unspecified: Secondary | ICD-10-CM | POA: Insufficient documentation

## 2016-08-02 DIAGNOSIS — K2970 Gastritis, unspecified, without bleeding: Secondary | ICD-10-CM

## 2016-08-02 DIAGNOSIS — K5730 Diverticulosis of large intestine without perforation or abscess without bleeding: Secondary | ICD-10-CM | POA: Diagnosis not present

## 2016-08-02 DIAGNOSIS — I10 Essential (primary) hypertension: Secondary | ICD-10-CM | POA: Diagnosis not present

## 2016-08-02 HISTORY — PX: ESOPHAGOGASTRODUODENOSCOPY (EGD) WITH PROPOFOL: SHX5813

## 2016-08-02 HISTORY — DX: Dorsopathy, unspecified: M53.9

## 2016-08-02 HISTORY — DX: Headache, unspecified: R51.9

## 2016-08-02 HISTORY — DX: Presence of dental prosthetic device (complete) (partial): Z97.2

## 2016-08-02 HISTORY — DX: Headache: R51

## 2016-08-02 HISTORY — PX: ESOPHAGEAL DILATION: SHX303

## 2016-08-02 HISTORY — PX: COLONOSCOPY WITH PROPOFOL: SHX5780

## 2016-08-02 SURGERY — COLONOSCOPY WITH PROPOFOL
Anesthesia: Monitor Anesthesia Care | Wound class: Contaminated

## 2016-08-02 MED ORDER — GLYCOPYRROLATE 0.2 MG/ML IJ SOLN
INTRAMUSCULAR | Status: DC | PRN
  Administered 2016-08-02: 0.2 mg via INTRAVENOUS

## 2016-08-02 MED ORDER — ACETAMINOPHEN 325 MG PO TABS
325.0000 mg | ORAL_TABLET | ORAL | Status: DC | PRN
Start: 2016-08-02 — End: 2016-08-02

## 2016-08-02 MED ORDER — SIMETHICONE 40 MG/0.6ML PO SUSP
ORAL | Status: DC | PRN
  Administered 2016-08-02: 11:00:00

## 2016-08-02 MED ORDER — LIDOCAINE HCL (CARDIAC) 20 MG/ML IV SOLN
INTRAVENOUS | Status: DC | PRN
  Administered 2016-08-02: 50 mg via INTRAVENOUS

## 2016-08-02 MED ORDER — ACETAMINOPHEN 160 MG/5ML PO SOLN: 325.0000 mg | ORAL | Status: DC | PRN

## 2016-08-02 MED ORDER — LACTATED RINGERS IV SOLN
INTRAVENOUS | Status: DC
  Administered 2016-08-02: 11:00:00 via INTRAVENOUS

## 2016-08-02 MED ORDER — PROPOFOL 10 MG/ML IV BOLUS
INTRAVENOUS | Status: DC | PRN
  Administered 2016-08-02 (×2): 30 mg via INTRAVENOUS
  Administered 2016-08-02: 20 mg via INTRAVENOUS
  Administered 2016-08-02: 10 mg via INTRAVENOUS
  Administered 2016-08-02 (×4): 20 mg via INTRAVENOUS
  Administered 2016-08-02: 30 mg via INTRAVENOUS
  Administered 2016-08-02 (×3): 20 mg via INTRAVENOUS
  Administered 2016-08-02: 30 mg via INTRAVENOUS
  Administered 2016-08-02: 60 mg via INTRAVENOUS

## 2016-08-02 SURGICAL SUPPLY — 35 items
BALLN DILATOR 10-12 8 (BALLOONS)
BALLN DILATOR 12-15 8 (BALLOONS)
BALLN DILATOR 15-18 8 (BALLOONS) ×4
BALLN DILATOR CRE 0-12 8 (BALLOONS)
BALLN DILATOR ESOPH 8 10 CRE (MISCELLANEOUS) IMPLANT
BALLOON DILATOR 12-15 8 (BALLOONS) IMPLANT
BALLOON DILATOR 15-18 8 (BALLOONS) ×2 IMPLANT
BALLOON DILATOR CRE 0-12 8 (BALLOONS) IMPLANT
BLOCK BITE 60FR ADLT L/F GRN (MISCELLANEOUS) ×4 IMPLANT
CANISTER SUCT 1200ML W/VALVE (MISCELLANEOUS) ×4 IMPLANT
CLIP HMST 235XBRD CATH ROT (MISCELLANEOUS) IMPLANT
CLIP RESOLUTION 360 11X235 (MISCELLANEOUS)
FCP ESCP3.2XJMB 240X2.8X (MISCELLANEOUS)
FORCEPS BIOP RAD 4 LRG CAP 4 (CUTTING FORCEPS) ×4 IMPLANT
FORCEPS BIOP RJ4 240 W/NDL (MISCELLANEOUS)
FORCEPS ESCP3.2XJMB 240X2.8X (MISCELLANEOUS) IMPLANT
GOWN CVR UNV OPN BCK APRN NK (MISCELLANEOUS) ×4 IMPLANT
GOWN ISOL THUMB LOOP REG UNIV (MISCELLANEOUS) ×4
INJECTOR VARIJECT VIN23 (MISCELLANEOUS) IMPLANT
KIT DEFENDO VALVE AND CONN (KITS) IMPLANT
KIT ENDO PROCEDURE OLY (KITS) ×4 IMPLANT
MARKER SPOT ENDO TATTOO 5ML (MISCELLANEOUS) IMPLANT
PAD GROUND ADULT SPLIT (MISCELLANEOUS) IMPLANT
PROBE APC STR FIRE (PROBE) IMPLANT
RETRIEVER NET PLAT FOOD (MISCELLANEOUS) IMPLANT
RETRIEVER NET ROTH 2.5X230 LF (MISCELLANEOUS) IMPLANT
SNARE SHORT THROW 13M SML OVAL (MISCELLANEOUS) ×4 IMPLANT
SNARE SHORT THROW 30M LRG OVAL (MISCELLANEOUS) IMPLANT
SNARE SNG USE RND 15MM (INSTRUMENTS) IMPLANT
SPOT EX ENDOSCOPIC TATTOO (MISCELLANEOUS)
SYR INFLATION 60ML (SYRINGE) ×4 IMPLANT
TRAP ETRAP POLY (MISCELLANEOUS) ×4 IMPLANT
VARIJECT INJECTOR VIN23 (MISCELLANEOUS)
WATER STERILE IRR 250ML POUR (IV SOLUTION) ×4 IMPLANT
WIRE CRE 18-20MM 8CM F G (MISCELLANEOUS) IMPLANT

## 2016-08-02 NOTE — H&P (Signed)
Jacqueline Lame, MD [address] [facility] [address] [facility] [address] [place], [place] [address] Phone: [phone] Fax : [phone]  Primary Care Physician:  Halina Maidens, MD Primary Gastroenterologist:  Dr. Allen Norris  Pre-Procedure History & Physical: HPI:  Jacqueline Harmon is a 60 y.o. female is here for an endoscopy and colonoscopy.   Past Medical History:  Diagnosis Date  . Headache    migraines - 1x/mo - better since topamax  . Hypercholesteremia   . Hypertension   . Multilevel degenerative disc disease   . Wears dentures    partial upper and lower    Past Surgical History:  Procedure Laterality Date  . BREAST BIOPSY Left 08/2014   papilloma    Prior to Admission medications   Medication Sig Start Date End Date Taking? Authorizing Provider  atorvastatin (LIPITOR) 10 MG tablet TAKE 1 TABLET (10 MG TOTAL) BY MOUTH DAILY. 02/25/16  Yes Glean Hess, MD  Butalbital-APAP-Caffeine 50-300-40 MG CAPS Take 1 capsule by mouth 2 (two) times daily as needed. migraine 03/23/15  Yes Glean Hess, MD  cyclobenzaprine (FLEXERIL) 10 MG tablet Take 1 tablet by mouth at bedtime.   Yes Historical Provider, MD  irbesartan (AVAPRO) 150 MG tablet Take 1 tablet (150 mg total) by mouth daily. 06/12/16  Yes Glean Hess, MD  Multiple Vitamins-Minerals (HAIR/SKIN/NAILS/BIOTIN) TABS Take by mouth.   Yes Historical Provider, MD  spironolactone (ALDACTONE) 50 MG tablet Take 1 tablet (50 mg total) by mouth daily. 06/12/16  Yes Glean Hess, MD  topiramate (TOPAMAX) 25 MG tablet TAKE 2 TABLETS (50 MG TOTAL) BY MOUTH AT BEDTIME. 05/21/16  Yes Glean Hess, MD    Allergies as of 07/16/2016 - Review Complete 07/16/2016  Allergen Reaction Noted  . Ace inhibitors Cough 03/23/2015  . Levofloxacin  03/23/2015  . Prednisone Itching 04/04/2016    Family History  Problem Relation Age of Onset  . Breast cancer Mother   . Cancer Mother   . Diabetes Father   . Osteoarthritis Father     Social History   Social  History  . Marital status: Married    Spouse name: N/A  . Number of children: N/A  . Years of education: N/A   Occupational History  . Not on file.   Social History Main Topics  . Smoking status: Current Every Day Smoker    Packs/day: 0.50    Years: 20.00    Types: Cigarettes  . Smokeless tobacco: Never Used  . Alcohol use 1.2 oz/week    2 Standard drinks or equivalent per week     Comment: occasionally  . Drug use: No  . Sexual activity: Not on file   Other Topics Concern  . Not on file   Social History Narrative  . No narrative on file    Review of Systems: See HPI, otherwise negative ROS  Physical Exam: Ht 5\' 5"  (1.651 m)   Wt 185 lb (83.9 kg)   BMI 30.79 kg/m  General:   Alert,  pleasant and cooperative in NAD Head:  Normocephalic and atraumatic. Neck:  Supple; no masses or thyromegaly. Lungs:  Clear throughout to auscultation.    Heart:  Regular rate and rhythm. Abdomen:  Soft, nontender and nondistended. Normal bowel sounds, without guarding, and without rebound.   Neurologic:  Alert and  oriented x4;  grossly normal neurologically.  Impression/Plan: DEYSI BERGEN is here for an endoscopy and colonoscopy to be performed for nausea and vomiting with screening  Risks, benefits, limitations, and alternatives  regarding  endoscopy and colonoscopy have been reviewed with the patient.  Questions have been answered.  All parties agreeable.   Jacqueline Lame, MD  08/02/2016, 10:35 AM

## 2016-08-02 NOTE — Anesthesia Postprocedure Evaluation (Signed)
Anesthesia Post Note  Patient: Jacqueline Harmon  Procedure(s) Performed: Procedure(s) (LRB): COLONOSCOPY WITH PROPOFOL (N/A) ESOPHAGOGASTRODUODENOSCOPY (EGD) WITH PROPOFOL (N/A) ESOPHAGEAL DILATION  Patient location during evaluation: PACU Anesthesia Type: MAC Level of consciousness: awake and alert Pain management: pain level controlled Vital Signs Assessment: post-procedure vital signs reviewed and stable Respiratory status: spontaneous breathing, nonlabored ventilation, respiratory function stable and patient connected to nasal cannula oxygen Cardiovascular status: stable and blood pressure returned to baseline Anesthetic complications: no    Amaryllis Dyke

## 2016-08-02 NOTE — Anesthesia Procedure Notes (Signed)
Procedure Name: MAC Performed by: Qiana Landgrebe Pre-anesthesia Checklist: Patient identified, Emergency Drugs available, Suction available, Timeout performed and Patient being monitored Patient Re-evaluated:Patient Re-evaluated prior to inductionOxygen Delivery Method: Nasal cannula Placement Confirmation: positive ETCO2       

## 2016-08-02 NOTE — Anesthesia Preprocedure Evaluation (Signed)
Anesthesia Evaluation  Patient identified by MRN, date of birth, ID band Patient awake    Airway Mallampati: II  TM Distance: >3 FB Neck ROM: full    Dental   Pulmonary Current Smoker,    breath sounds clear to auscultation       Cardiovascular hypertension, Normal cardiovascular exam     Neuro/Psych  Headaches,    GI/Hepatic GERD  ,  Endo/Other    Renal/GU      Musculoskeletal   Abdominal   Peds  Hematology   Anesthesia Other Findings   Reproductive/Obstetrics                             Anesthesia Physical Anesthesia Plan  ASA: II  Anesthesia Plan: MAC   Post-op Pain Management:    Induction:   Airway Management Planned:   Additional Equipment:   Intra-op Plan:   Post-operative Plan:   Informed Consent: I have reviewed the patients History and Physical, chart, labs and discussed the procedure including the risks, benefits and alternatives for the proposed anesthesia with the patient or authorized representative who has indicated his/her understanding and acceptance.     Plan Discussed with: CRNA  Anesthesia Plan Comments:         Anesthesia Quick Evaluation

## 2016-08-02 NOTE — Op Note (Signed)
[facility] Gastroenterology Patient Name: Jacqueline Harmon Procedure Date: 08/02/2016 11:19 AM MRN: [id number] Account #: [id number] Date of Birth: [birth year] Admit Type: Outpatient Age: 60 Room: [facility] OR ROOM 01 Gender: Female Note Status: Finalized Procedure:            Upper GI endoscopy Indications:          Nausea with vomiting Providers:            [name] MD, MD Referring MD:         Halina Maidens, MD (Referring MD) Medicines:            Propofol per Anesthesia Complications:        No immediate complications. Procedure:            Pre-Anesthesia Assessment:                       - Prior to the procedure, a History and Physical was                        performed, and patient medications and allergies were                        reviewed. The patient's tolerance of previous                        anesthesia was also reviewed. The risks and benefits of                        the procedure and the sedation options and risks were                        discussed with the patient. All questions were                        answered, and informed consent was obtained. Prior                        Anticoagulants: The patient has taken no previous                        anticoagulant or antiplatelet agents. ASA Grade                        Assessment: II - A patient with mild systemic disease.                        After reviewing the risks and benefits, the patient was                        deemed in satisfactory condition to undergo the                        procedure.                       After obtaining informed consent, the endoscope was                        passed under direct vision. Throughout the procedure,  the patient's blood pressure, pulse, and oxygen                        saturations were monitored continuously. The Olympus                        GIF-HQ190 Endoscope (S#. [id number]) was introduced   through the mouth, and advanced to the second part of                        duodenum. The upper GI endoscopy was accomplished                        without difficulty. The patient tolerated the procedure                        well. Findings:      A medium-sized hiatal hernia was present.      One mild benign-appearing, intrinsic stenosis was found at the       gastroesophageal junction. And was traversed. A TTS dilator was passed       through the scope. Dilation with a 15-16.5-18 mm balloon dilator was       performed to 18 mm. The dilation site was examined following endoscope       reinsertion and showed complete resolution of luminal narrowing.      Localized severe inflammation characterized by erosions and erythema was       found in the gastric antrum. Biopsies were taken with a cold forceps for       histology.      The examined duodenum was normal. Impression:           - Medium-sized hiatal hernia.                       - Benign-appearing esophageal stenosis. Dilated.                       - Gastritis. Biopsied.                       - Normal examined duodenum. Recommendation:       - Await pathology results.                       - Discharge patient to home.                       - Resume previous diet.                       - Continue present medications. Procedure Code(s):    --- Professional ---                       [phone], Esophagogastroduodenoscopy, flexible, transoral;                        with transendoscopic balloon dilation of esophagus                        (less than 30 mm diameter)                       43239, Esophagogastroduodenoscopy, flexible,  transoral;                        with biopsy, single or multiple Diagnosis Code(s):    --- Professional ---                       R11.2, Nausea with vomiting, unspecified                       K29.70, Gastritis, unspecified, without bleeding                       K22.2, Esophageal obstruction CPT copyright 2016  American Medical Association. All rights reserved. The codes documented in this report are preliminary and upon coder review may  be revised to meet current compliance requirements. [name] MD, MD 08/02/2016 11:30:46 AM This report has been signed electronically. Number of Addenda: 0 Note Initiated On: 08/02/2016 11:19 AM      [facility]

## 2016-08-02 NOTE — Op Note (Signed)
[facility] Gastroenterology Patient Name: Jacqueline Harmon Procedure Date: 08/02/2016 11:14 AM MRN: [id number] Account #: [id number] Date of Birth: 1956-08-03 Admit Type: Outpatient Age: 60 Room: [facility] OR ROOM 01 Gender: Female Note Status: Finalized Procedure:            Colonoscopy Indications:          Screening for colorectal malignant neoplasm Providers:            [name] MD, MD Referring MD:         Halina Maidens, MD (Referring MD) Medicines:            Propofol per Anesthesia Complications:        No immediate complications. Procedure:            Pre-Anesthesia Assessment:                       - Prior to the procedure, a History and Physical was                        performed, and patient medications and allergies were                        reviewed. The patient's tolerance of previous                        anesthesia was also reviewed. The risks and benefits of                        the procedure and the sedation options and risks were                        discussed with the patient. All questions were                        answered, and informed consent was obtained. Prior                        Anticoagulants: The patient has taken no previous                        anticoagulant or antiplatelet agents. ASA Grade                        Assessment: II - A patient with mild systemic disease.                        After reviewing the risks and benefits, the patient was                        deemed in satisfactory condition to undergo the                        procedure.                       After obtaining informed consent, the colonoscope was                        passed under direct vision. Throughout the procedure,  the patient's blood pressure, pulse, and oxygen                        saturations were monitored continuously. The Olympus                        PCF H180AL colonoscope (S#: [id number]) was introduced                         through the anus and advanced to the the cecum,                        identified by appendiceal orifice and ileocecal valve.                        The colonoscopy was performed without difficulty. The                        patient tolerated the procedure well. The quality of                        the bowel preparation was good. Findings:      The perianal and digital rectal examinations were normal.      A 5 mm polyp was found in the ascending colon. The polyp was sessile.       The polyp was removed with a cold snare. Resection and retrieval were       complete.      A 6 mm polyp was found in the transverse colon. The polyp was sessile.       The polyp was removed with a cold snare. Resection and retrieval were       complete.      Non-bleeding internal hemorrhoids were found during retroflexion. The       hemorrhoids were Grade II (internal hemorrhoids that prolapse but reduce       spontaneously).      Multiple small-mouthed diverticula were found in the sigmoid colon. Impression:           - One 5 mm polyp in the ascending colon, removed with a                        cold snare. Resected and retrieved.                       - One 6 mm polyp in the transverse colon, removed with                        a cold snare. Resected and retrieved.                       - Non-bleeding internal hemorrhoids.                       - Diverticulosis in the sigmoid colon. Recommendation:       - Discharge patient to home.                       - Resume previous diet.                       - Continue present  medications.                       - Await pathology results. Procedure Code(s):    --- Professional ---                       [id number], Colonoscopy, flexible; with removal of tumor(s),                        polyp(s), or other lesion(s) by snare technique Diagnosis Code(s):    --- Professional ---                       Z12.11, Encounter for screening for malignant neoplasm                         of colon                       D12.2, Benign neoplasm of ascending colon                       D12.3, Benign neoplasm of transverse colon (hepatic                        flexure or splenic flexure) CPT copyright 2016 American Medical Association. All rights reserved. The codes documented in this report are preliminary and upon coder review may  be revised to meet current compliance requirements. [name] MD, MD 08/02/2016 11:44:40 AM This report has been signed electronically. Number of Addenda: 0 Note Initiated On: 08/02/2016 11:14 AM Scope Withdrawal Time: 0 hours 6 minutes 30 seconds  Total Procedure Duration: 0 hours 9 minutes 35 seconds       [facility]

## 2016-08-02 NOTE — Transfer of Care (Signed)
Immediate Anesthesia Transfer of Care Note  Patient: Jacqueline Harmon  Procedure(s) Performed: Procedure(s): COLONOSCOPY WITH PROPOFOL (N/A) ESOPHAGOGASTRODUODENOSCOPY (EGD) WITH PROPOFOL (N/A) ESOPHAGEAL DILATION  Patient Location: PACU  Anesthesia Type: MAC  Level of Consciousness: awake, alert  and patient cooperative  Airway and Oxygen Therapy: Patient Spontanous Breathing and Patient connected to supplemental oxygen  Post-op Assessment: Post-op Vital signs reviewed, Patient's Cardiovascular Status Stable, Respiratory Function Stable, Patent Airway and No signs of Nausea or vomiting  Post-op Vital Signs: Reviewed and stable  Complications: No apparent anesthesia complications

## 2016-08-03 ENCOUNTER — Encounter: Payer: Self-pay | Admitting: Gastroenterology

## 2016-08-14 ENCOUNTER — Encounter: Payer: Self-pay | Admitting: Gastroenterology

## 2016-08-16 ENCOUNTER — Telehealth: Payer: Self-pay

## 2016-08-16 NOTE — Telephone Encounter (Signed)
-----   Message from [name], MD sent at 08/09/2016  1:01 PM EDT ----- Please have the patient come in for a follow up.

## 2016-08-16 NOTE — Telephone Encounter (Signed)
Pt scheduled for a follow up appt with Dr. Allen Norris on November 20th.

## 2016-08-27 ENCOUNTER — Encounter: Payer: Self-pay | Admitting: Gastroenterology

## 2016-08-27 ENCOUNTER — Ambulatory Visit (INDEPENDENT_AMBULATORY_CARE_PROVIDER_SITE_OTHER): Payer: Managed Care, Other (non HMO) | Admitting: Gastroenterology

## 2016-08-27 VITALS — BP 149/78 | HR 69 | Temp 98.0°F | Ht 65.0 in | Wt 188.0 lb

## 2016-08-27 DIAGNOSIS — K2940 Chronic atrophic gastritis without bleeding: Secondary | ICD-10-CM

## 2016-08-27 NOTE — Progress Notes (Signed)
Primary Care Physician: Halina Maidens, MD  Primary Gastroenterologist:  Dr. [name]  Chief Complaint  Patient presents with  . Follow up procedure results    Colonoscopy and EGD on 08/02/16    HPI: Jacqueline Harmon is a 60 y.o. female here for follow-up after having an EGD and colonoscopy. The patient reports that she had nausea mostly in the morning when she brushes her teeth. The patient also states that it has been a little better since having her upper endoscopy colonoscopy. The patient's colonoscopy showed a adenomatous polyp area the patient's esophagus showed a stricture that was dilated and a hiatal hernia. The biopsies of the stomach showed the patient to have atrophic gastritis. The stomach biopsies also showed intestinal metaplasia.   Current Outpatient Prescriptions  Medication Sig Dispense Refill  . atorvastatin (LIPITOR) 10 MG tablet TAKE 1 TABLET (10 MG TOTAL) BY MOUTH DAILY. 90 tablet 1  . Butalbital-APAP-Caffeine 50-300-40 MG CAPS Take 1 capsule by mouth 2 (two) times daily as needed. migraine 60 capsule 5  . cyclobenzaprine (FLEXERIL) 10 MG tablet Take 1 tablet by mouth at bedtime.    . irbesartan (AVAPRO) 150 MG tablet Take 1 tablet (150 mg total) by mouth daily. 90 tablet 1  . Multiple Vitamins-Minerals (HAIR/SKIN/NAILS/BIOTIN) TABS Take by mouth.    . spironolactone (ALDACTONE) 50 MG tablet Take 1 tablet (50 mg total) by mouth daily. 90 tablet 1  . topiramate (TOPAMAX) 25 MG tablet TAKE 2 TABLETS (50 MG TOTAL) BY MOUTH AT BEDTIME. 180 tablet 1   No current facility-administered medications for this visit.     Allergies as of 08/27/2016 - Review Complete 08/27/2016  Allergen Reaction Noted  . Ace inhibitors Cough 03/23/2015  . Levofloxacin  03/23/2015  . Prednisone Itching 04/04/2016    ROS:  General: Negative for anorexia, weight loss, fever, chills, fatigue, weakness. ENT: Negative for hoarseness, difficulty swallowing , nasal congestion. CV:  Negative for chest pain, angina, palpitations, dyspnea on exertion, peripheral edema.  Respiratory: Negative for dyspnea at rest, dyspnea on exertion, cough, sputum, wheezing.  GI: See history of present illness. GU:  Negative for dysuria, hematuria, urinary incontinence, urinary frequency, nocturnal urination.  Endo: Negative for unusual weight change.    Physical Examination:   BP (!) 149/78   Pulse 69   Temp 98 F (36.7 C) (Oral)   Ht 5\' 5"  (1.651 m)   Wt 188 lb (85.3 kg)   BMI 31.28 kg/m   General: Well-nourished, well-developed in no acute distress.  Eyes: No icterus. Conjunctivae pink. Extremities: No lower extremity edema. No clubbing or deformities. Neuro: Alert and oriented x 3.  Grossly intact. Skin: Warm and dry, no jaundice.   Psych: Alert and cooperative, normal mood and affect.  Labs:    Imaging Studies: No results found.  Assessment and Plan:   Jacqueline Harmon is a 60 y.o. y/o female who comes for follow-up after having a EGD and colonoscopy. The patient had a adenomatous polyp and will need a repeat colonoscopy in 5 years. The patient also had atrophic gastritis and will have her blood sent off for possible pernicious anemia. She will also be set up for an EGD in 3 years because of her gastric intestinal metaplasia. The patient will also be given a trial of Dexilant to be taken before she goes to sleep to see if that improves her nausea despite having atrophic gastritis suggesting the patient likely has less gastric acid and normal. He should has been explained  the plan and agrees with it    [name], MD. Mar[facility] Regal   Note: This dictation was prepared with Dragon dictation along with smaller phrase technology. Any transcriptional errors that result from this process are unintentional.

## 2016-08-27 NOTE — Patient Instructions (Signed)
You have been given a lab order today. Please go to any [facility] or [facility] outpatient lab.   If you have any questions or symptoms do not improve, please contact our office.

## 2016-08-28 LAB — INTRINSIC FACTOR ANTIBODIES: Intrinsic Factor Abs, Serum: 1 AU/mL (ref 0.0–1.1)

## 2016-09-06 ENCOUNTER — Other Ambulatory Visit: Payer: Self-pay | Admitting: Internal Medicine

## 2016-09-07 ENCOUNTER — Telehealth: Payer: Self-pay

## 2016-09-07 NOTE — Telephone Encounter (Signed)
Pt notified of lab result. EGD information has been updated.

## 2016-09-07 NOTE — Telephone Encounter (Signed)
-----   Message from [name], MD sent at 09/02/2016  9:04 AM EST ----- Let the patient know that her blood tests for pernicious anemia was negative.  Her next EGD should be in 3 years.

## 2016-09-11 ENCOUNTER — Ambulatory Visit (INDEPENDENT_AMBULATORY_CARE_PROVIDER_SITE_OTHER): Payer: Managed Care, Other (non HMO) | Admitting: Internal Medicine

## 2016-09-11 ENCOUNTER — Encounter: Payer: Self-pay | Admitting: Internal Medicine

## 2016-09-11 VITALS — BP 138/82 | HR 87 | Resp 16 | Ht 65.0 in | Wt 184.4 lb

## 2016-09-11 DIAGNOSIS — Z720 Tobacco use: Secondary | ICD-10-CM

## 2016-09-11 DIAGNOSIS — I10 Essential (primary) hypertension: Secondary | ICD-10-CM | POA: Diagnosis not present

## 2016-09-11 DIAGNOSIS — F17200 Nicotine dependence, unspecified, uncomplicated: Secondary | ICD-10-CM

## 2016-09-11 DIAGNOSIS — Z87448 Personal history of other diseases of urinary system: Secondary | ICD-10-CM

## 2016-09-11 DIAGNOSIS — K219 Gastro-esophageal reflux disease without esophagitis: Secondary | ICD-10-CM

## 2016-09-11 DIAGNOSIS — G5601 Carpal tunnel syndrome, right upper limb: Secondary | ICD-10-CM

## 2016-09-11 NOTE — Progress Notes (Signed)
Date:  09/11/2016   Name:  Jacqueline Harmon   DOB:  1956-05-17   MRN:  [id number]   Chief Complaint: Hypertension and Shortness of Breath (Hx of smoking.Having tingling in Right hand and swelling in upper arm. ) Hypertension  This is a chronic problem. The problem has been gradually improving since onset. The problem is controlled. Associated symptoms include shortness of breath. Pertinent negatives include no chest pain, headaches or palpitations. Past treatments include angiotensin blockers and diuretics. The current treatment provides significant improvement.  Shortness of Breath  This is a new problem. The problem occurs constantly. Pertinent negatives include no abdominal pain, chest pain, headaches, leg swelling or wheezing. The symptoms are aggravated by exercise. Risk factors include smoking.  Tingling in finger tips - wakes up in the AM with tingling on right goes away during the day.  Also has some tight feeling in the right upper arm. Nausea -  Had EGD showing atrophic gastritis, eso stricture and HH (dilated) and gastric metaplasia.  She was given a trial of Dexilant but she did not take it.  Still has nausea but no more vomiting/gagging or food sticking in esophagus.   Review of Systems  Constitutional: Negative for chills, fatigue and unexpected weight change.  Respiratory: Positive for shortness of breath. Negative for choking, chest tightness and wheezing.   Cardiovascular: Negative for chest pain, palpitations and leg swelling.  Gastrointestinal: Positive for nausea. Negative for abdominal distention, abdominal pain and blood in stool.  Musculoskeletal: Negative for arthralgias.  Neurological: Positive for numbness. Negative for dizziness, tremors, syncope, weakness and headaches.  Hematological: Negative for adenopathy.  Psychiatric/Behavioral: Negative for sleep disturbance.    Patient Active Problem List   Diagnosis Date Noted  . Gastritis without bleeding   .  Special screening for malignant neoplasms, colon   . Adenomatous colon polyp   . Benign neoplasm of transverse colon   . Hyperlipidemia 06/10/2015  . Smokes tobacco daily 03/23/2015  . Essential (primary) hypertension 03/23/2015  . Bilateral fibrocystic breast disease 03/23/2015  . Acid reflux 03/23/2015  . Hot flash, menopausal 03/23/2015  . Migraine without aura and responsive to treatment 03/23/2015  . 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 Authorizing Provider  atorvastatin (LIPITOR) 10 MG tablet TAKE 1 TABLET (10 MG TOTAL) BY MOUTH DAILY. 09/06/16  Yes Glean Hess, MD  Butalbital-APAP-Caffeine 50-300-40 MG CAPS Take 1 capsule by mouth 2 (two) times daily as needed. migraine 03/23/15  Yes Glean Hess, MD  cyclobenzaprine (FLEXERIL) 10 MG tablet Take 1 tablet by mouth at bedtime.   Yes Historical Provider, MD  irbesartan (AVAPRO) 150 MG tablet Take 1 tablet (150 mg total) by mouth daily. Patient taking differently: Take 150 mg by mouth daily. 1/2 tab 06/12/16  Yes Glean Hess, MD  spironolactone (ALDACTONE) 50 MG tablet Take 1 tablet (50 mg total) by mouth daily. 06/12/16  Yes Glean Hess, MD  topiramate (TOPAMAX) 25 MG tablet TAKE 2 TABLETS (50 MG TOTAL) BY MOUTH AT BEDTIME. 05/21/16  Yes Glean Hess, MD    Allergies  Allergen Reactions  . Ace Inhibitors Cough  . Levofloxacin     myalgia, constipation  . Prednisone Itching    Past Surgical History:  Procedure Laterality Date  . BREAST BIOPSY Left 08/2014   papilloma  . COLONOSCOPY WITH PROPOFOL N/A 08/02/2016   Procedure: COLONOSCOPY WITH PROPOFOL;  Surgeon: [name], MD;  Location: [place];  Service: Endoscopy;  Laterality: N/A;  . ESOPHAGEAL DILATION  08/02/2016   Procedure: ESOPHAGEAL DILATION;  Surgeon: [name], MD;  Location: [place];  Service: Endoscopy;;  . ESOPHAGOGASTRODUODENOSCOPY (EGD) WITH PROPOFOL  N/A 08/02/2016   Repeat EGD 08/2019. Performed by Dr. [name]    Social History  Substance Use Topics  . Smoking status: Current Every Day Smoker    Packs/day: 0.50    Years: 20.00    Types: Cigarettes  . Smokeless tobacco: Never Used  . Alcohol use 1.2 oz/week    2 Standard drinks or equivalent per week     Comment: occasionally     Medication list has been reviewed and updated.   Physical Exam  Constitutional: She is oriented to person, place, and time. She appears well-developed. No distress.  HENT:  Head: Normocephalic and atraumatic.  Neck: Normal range of motion. Neck supple. No thyromegaly present.  Cardiovascular: Normal rate, regular rhythm and normal heart sounds.   Pulmonary/Chest: Effort normal and breath sounds normal. No respiratory distress. She has no wheezes. She has no rales.  Musculoskeletal: Normal range of motion. She exhibits no edema.  Tinel's and Phalen's negative on right  Neurological: She is alert and oriented to person, place, and time.  Skin: Skin is warm and dry. No rash noted.  Psychiatric: She has a normal mood and affect. Her behavior is normal. Thought content normal.  Nursing note and vitals reviewed.   BP 138/82   Pulse 87   Resp 16   Ht 5\' 5"  (1.651 m)   Wt 184 lb 6.4 oz (83.6 kg)   SpO2 96%   BMI 30.69 kg/m   Assessment and Plan: 1. Essential (primary) hypertension Continue adequate fluid intake and current medications - Basic metabolic panel  2. History of decreased renal function - Basic metabolic panel  3. Smokes tobacco daily Suspect mild early COPD from [facility] 2016 Patient wants to try nicotine patches through a program at her work Consider inhaled medication if sx worsen  4. Carpal tunnel syndrome on right Patient advised - no treatment needed at this time due to mild intermittent sx Return if worsening  5. Gastroesophageal reflux disease without esophagitis Recommend trial of Dexilant as recommended by  GI   Halina Maidens, MD [facility]  09/11/2016

## 2016-09-12 LAB — BASIC METABOLIC PANEL
BUN/Creatinine Ratio: 10 (ref 9–23)
BUN: 11 mg/dL (ref 6–24)
CO2: 23 mmol/L (ref 18–29)
Calcium: 9.5 mg/dL (ref 8.7–10.2)
Chloride: 103 mmol/L (ref 96–106)
Creatinine, Ser: 1.14 mg/dL — ABNORMAL HIGH (ref 0.57–1.00)
GFR, EST AFRICAN AMERICAN: 61 mL/min/{1.73_m2} (ref >59)
GFR, EST NON AFRICAN AMERICAN: 53 mL/min/{1.73_m2} — AB (ref >59)
Glucose: 58 mg/dL — ABNORMAL LOW (ref 65–99)
POTASSIUM: 4.8 mmol/L (ref 3.5–5.2)
SODIUM: 142 mmol/L (ref 134–144)

## 2016-10-06 ENCOUNTER — Other Ambulatory Visit: Payer: Self-pay | Admitting: Internal Medicine

## 2016-10-06 DIAGNOSIS — I10 Essential (primary) hypertension: Secondary | ICD-10-CM

## 2016-10-12 ENCOUNTER — Encounter: Payer: Self-pay | Admitting: Internal Medicine

## 2016-10-12 ENCOUNTER — Ambulatory Visit (INDEPENDENT_AMBULATORY_CARE_PROVIDER_SITE_OTHER): Payer: Managed Care, Other (non HMO) | Admitting: Internal Medicine

## 2016-10-12 VITALS — BP 140/92 | HR 78 | Temp 97.9°F | Ht 65.0 in | Wt 181.0 lb

## 2016-10-12 DIAGNOSIS — J40 Bronchitis, not specified as acute or chronic: Secondary | ICD-10-CM | POA: Diagnosis not present

## 2016-10-12 MED ORDER — ALBUTEROL SULFATE HFA 108 (90 BASE) MCG/ACT IN AERS: 2.0000 | INHALATION_SPRAY | Freq: Four times a day (QID) | RESPIRATORY_TRACT | 0 refills | Status: AC | PRN

## 2016-10-12 MED ORDER — GUAIFENESIN-CODEINE 100-10 MG/5ML PO SYRP: 5.0000 mL | ORAL_SOLUTION | Freq: Three times a day (TID) | ORAL | 0 refills | Status: DC | PRN

## 2016-10-12 MED ORDER — CEFDINIR 300 MG PO CAPS: 300.0000 mg | ORAL_CAPSULE | Freq: Two times a day (BID) | ORAL | 0 refills | Status: DC

## 2016-10-12 NOTE — Progress Notes (Signed)
Date:  10/12/2016   Name:  Jacqueline Harmon   DOB:  07-06-1956   MRN:  [id number]   Chief Complaint: Sinus Problem (Pt stated coughing up clear mucus and shortness of breath. ) Sinus Problem  This is a new problem. The current episode started in the past 7 days. The problem has been gradually worsening since onset. There has been no fever (had fever for 2 days last week but none now). Associated symptoms include congestion, coughing, headaches, shortness of breath and sinus pressure. Pertinent negatives include no chills or sore throat. Past treatments include oral decongestants and acetaminophen. The treatment provided mild relief.    Review of Systems  Constitutional: Positive for fatigue. Negative for chills and fever.  HENT: Positive for congestion and sinus pressure. Negative for sore throat, tinnitus, trouble swallowing and voice change.   Eyes: Negative for visual disturbance.  Respiratory: Positive for cough, shortness of breath and wheezing.   Cardiovascular: Negative for chest pain, palpitations and leg swelling.  Gastrointestinal: Positive for diarrhea and vomiting.  Musculoskeletal: Positive for myalgias. Negative for arthralgias.  Neurological: Positive for headaches. Negative for dizziness and syncope.    Patient Active Problem List   Diagnosis Date Noted  . Gastritis without bleeding   . Special screening for malignant neoplasms, colon   . Adenomatous colon polyp   . Benign neoplasm of transverse colon   . Hyperlipidemia 06/10/2015  . Smokes tobacco daily 03/23/2015  . Essential (primary) hypertension 03/23/2015  . Bilateral fibrocystic breast disease 03/23/2015  . Acid reflux 03/23/2015  . Hot flash, menopausal 03/23/2015  . Migraine without aura and responsive to treatment 03/23/2015  . 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 Authorizing Provider  atorvastatin (LIPITOR) 10 MG tablet TAKE 1 TABLET  (10 MG TOTAL) BY MOUTH DAILY. 09/06/16  Yes Glean Hess, MD  irbesartan (AVAPRO) 150 MG tablet Take 1 tablet (150 mg total) by mouth daily. Patient taking differently: Take 150 mg by mouth daily. 1/2 tab 06/12/16  Yes Glean Hess, MD  PE-GG-APAP & PE-DPH-APAP [facility] SINUS-MAX DAY/NIGHT) Liquid MISC Take by mouth.   Yes Historical Provider, MD  spironolactone (ALDACTONE) 50 MG tablet TAKE 1 TABLET (50 MG TOTAL) BY MOUTH DAILY. 10/06/16  Yes Glean Hess, MD  topiramate (TOPAMAX) 25 MG tablet TAKE 2 TABLETS (50 MG TOTAL) BY MOUTH AT BEDTIME. 05/21/16  Yes Glean Hess, MD  Butalbital-APAP-Caffeine 50-300-40 MG CAPS Take 1 capsule by mouth 2 (two) times daily as needed. migraine Patient not taking: Reported on 10/12/2016 03/23/15   Glean Hess, MD  cyclobenzaprine (FLEXERIL) 10 MG tablet Take 1 tablet by mouth at bedtime.    Historical Provider, MD    Allergies  Allergen Reactions  . Ace Inhibitors Cough  . Levofloxacin     myalgia, constipation  . Prednisone Itching    Past Surgical History:  Procedure Laterality Date  . BREAST BIOPSY Left 08/2014   papilloma  . COLONOSCOPY WITH PROPOFOL N/A 08/02/2016   Procedure: COLONOSCOPY WITH PROPOFOL;  Surgeon: [name], MD;  Location: [place];  Service: Endoscopy;  Laterality: N/A;  . ESOPHAGEAL DILATION  08/02/2016   Procedure: ESOPHAGEAL DILATION;  Surgeon: [name], MD;  Location: [place];  Service: Endoscopy;;  . ESOPHAGOGASTRODUODENOSCOPY (EGD) WITH PROPOFOL N/A 08/02/2016   Repeat EGD 08/2019. Performed by Dr. [name]    Social History  Substance Use Topics  . Smoking  status: Current Every Day Smoker    Packs/day: 0.50    Years: 20.00    Types: Cigarettes  . Smokeless tobacco: Never Used  . Alcohol use 1.2 oz/week    2 Standard drinks or equivalent per week     Comment: occasionally     Medication list has been reviewed and updated.   Physical Exam  Constitutional: She is  oriented to person, place, and time. She appears well-developed and well-nourished. She appears ill.  HENT:  Right Ear: External ear and ear canal normal. Tympanic membrane is retracted. Tympanic membrane is not erythematous.  Left Ear: External ear and ear canal normal. Tympanic membrane is retracted. Tympanic membrane is not erythematous.  Nose: Right sinus exhibits maxillary sinus tenderness and frontal sinus tenderness. Left sinus exhibits maxillary sinus tenderness and frontal sinus tenderness.  Mouth/Throat: Uvula is midline and mucous membranes are normal. No oral lesions. No oropharyngeal exudate or posterior oropharyngeal erythema.  Cardiovascular: Normal rate, regular rhythm and normal heart sounds.   Pulmonary/Chest: She has decreased breath sounds. She has rhonchi in the right upper field and the left upper field.  Lymphadenopathy:    She has no cervical adenopathy.  Neurological: She is alert and oriented to person, place, and time.    BP (!) 140/92   Pulse 78   Temp 97.9 F (36.6 C)   Ht 5\' 5"  (1.651 m)   Wt 181 lb (82.1 kg)   SpO2 99%   BMI 30.12 kg/m   Assessment and Plan: 1. Bronchitis - cefdinir (OMNICEF) 300 MG capsule; Take 1 capsule (300 mg total) by mouth 2 (two) times daily.  Dispense: 20 capsule; Refill: 0 - albuterol (PROVENTIL HFA;VENTOLIN HFA) 108 (90 Base) MCG/ACT inhaler; Inhale 2 puffs into the lungs every 6 (six) hours as needed for wheezing or shortness of breath.  Dispense: 1 Inhaler; Refill: 0 - guaiFENesin-codeine (ROBITUSSIN AC) 100-10 MG/5ML syrup; Take 5 mLs by mouth 3 (three) times daily as needed for cough.  Dispense: 236 mL; Refill: 0   Halina Maidens, MD [facility]  10/12/2016

## 2016-10-31 ENCOUNTER — Ambulatory Visit (INDEPENDENT_AMBULATORY_CARE_PROVIDER_SITE_OTHER): Payer: Managed Care, Other (non HMO) | Admitting: Internal Medicine

## 2016-10-31 ENCOUNTER — Encounter: Payer: Self-pay | Admitting: Internal Medicine

## 2016-10-31 VITALS — BP 126/82 | HR 84 | Temp 98.0°F | Ht 65.0 in | Wt 184.0 lb

## 2016-10-31 DIAGNOSIS — B9789 Other viral agents as the cause of diseases classified elsewhere: Secondary | ICD-10-CM

## 2016-10-31 DIAGNOSIS — J069 Acute upper respiratory infection, unspecified: Secondary | ICD-10-CM

## 2016-10-31 MED ORDER — AZELASTINE HCL 0.1 % NA SOLN: 2.0000 | Freq: Two times a day (BID) | NASAL | 1 refills | Status: DC

## 2016-10-31 NOTE — Patient Instructions (Signed)
Zyrtec 10 mg once a day

## 2016-10-31 NOTE — Progress Notes (Signed)
Date:  10/31/2016   Name:  Jacqueline Harmon   DOB:  07-08-1956   MRN:  [id number]  Seen 18 days ago with bronchitis/sinusitis and treated with Omnicef. Chief Complaint: Cough Cough  This is a recurrent problem. The current episode started 1 to 4 weeks ago. The problem has been rapidly improving. The problem occurs every few minutes. The cough is non-productive. Associated symptoms include postnasal drip. Pertinent negatives include no chest pain, chills, fever, headaches, sore throat, shortness of breath or wheezing. She has tried OTC cough suppressant for the symptoms. The treatment provided moderate relief.   Also severe runny nose of clear liquid.    Review of Systems  Constitutional: Negative for chills, fatigue and fever.  HENT: Positive for postnasal drip. Negative for congestion, sore throat and trouble swallowing.   Respiratory: Positive for cough. Negative for chest tightness, shortness of breath and wheezing.   Cardiovascular: Negative for chest pain and palpitations.  Gastrointestinal: Negative for diarrhea and vomiting.  Neurological: Negative for dizziness and headaches.    Patient Active Problem List   Diagnosis Date Noted  . Gastritis without bleeding   . Special screening for malignant neoplasms, colon   . Adenomatous colon polyp   . Benign neoplasm of transverse colon   . Hyperlipidemia 06/10/2015  . Smokes tobacco daily 03/23/2015  . Essential (primary) hypertension 03/23/2015  . Bilateral fibrocystic breast disease 03/23/2015  . Acid reflux 03/23/2015  . Hot flash, menopausal 03/23/2015  . Migraine without aura and responsive to treatment 03/23/2015  . 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 Authorizing Provider  albuterol (PROVENTIL HFA;VENTOLIN HFA) 108 (90 Base) MCG/ACT inhaler Inhale 2 puffs into the lungs every 6 (six) hours as needed for wheezing or shortness of breath. 10/12/16  Yes Glean Hess, MD  atorvastatin (LIPITOR) 10 MG tablet TAKE 1 TABLET (10 MG TOTAL) BY MOUTH DAILY. 09/06/16  Yes Glean Hess, MD  Butalbital-APAP-Caffeine 50-300-40 MG CAPS Take 1 capsule by mouth 2 (two) times daily as needed. migraine 03/23/15  Yes Glean Hess, MD  guaiFENesin-codeine [facility]) 100-10 MG/5ML syrup Take 5 mLs by mouth 3 (three) times daily as needed for cough. 10/12/16  Yes Glean Hess, MD  irbesartan (AVAPRO) 150 MG tablet Take 1 tablet (150 mg total) by mouth daily. Patient taking differently: Take 150 mg by mouth daily. 1/2 tab 06/12/16  Yes Glean Hess, MD  spironolactone (ALDACTONE) 50 MG tablet TAKE 1 TABLET (50 MG TOTAL) BY MOUTH DAILY. 10/06/16  Yes Glean Hess, MD  topiramate (TOPAMAX) 25 MG tablet TAKE 2 TABLETS (50 MG TOTAL) BY MOUTH AT BEDTIME. 05/21/16  Yes Glean Hess, MD  cyclobenzaprine (FLEXERIL) 10 MG tablet Take 1 tablet by mouth at bedtime.    Historical Provider, MD  dexlansoprazole (DEXILANT) 60 MG capsule Take 60 mg by mouth daily.    Historical Provider, MD  PE-GG-APAP & PE-DPH-APAP (MUCINEX SINUS-MAX DAY/NIGHT) Liquid MISC Take by mouth.    Historical Provider, MD    Allergies  Allergen Reactions  . Ace Inhibitors Cough  . Levofloxacin     myalgia, constipation  . Prednisone Itching    Past Surgical History:  Procedure Laterality Date  . BREAST BIOPSY Left 08/2014   papilloma  . COLONOSCOPY WITH PROPOFOL N/A 08/02/2016   Procedure: COLONOSCOPY WITH PROPOFOL;  Surgeon: [name], MD;  Location: [place];  Service: Endoscopy;  Laterality: N/A;  .  ESOPHAGEAL DILATION  08/02/2016   Procedure: ESOPHAGEAL DILATION;  Surgeon: [name], MD;  Location: [place];  Service: Endoscopy;;  . ESOPHAGOGASTRODUODENOSCOPY (EGD) WITH PROPOFOL N/A 08/02/2016   Repeat EGD 08/2019. Performed by Dr. [name]    Social History  Substance Use Topics  . Smoking status: Current Every Day Smoker    Packs/day:  0.50    Years: 20.00    Types: Cigarettes  . Smokeless tobacco: Never Used  . Alcohol use 1.2 oz/week    2 Standard drinks or equivalent per week     Comment: occasionally     Medication list has been reviewed and updated.   Physical Exam  Constitutional: She is oriented to person, place, and time. She appears well-developed. No distress.  HENT:  Head: Normocephalic and atraumatic.  Right Ear: Tympanic membrane and ear canal normal.  Left Ear: Tympanic membrane and ear canal normal.  Nose: Right sinus exhibits no maxillary sinus tenderness and no frontal sinus tenderness. Left sinus exhibits no maxillary sinus tenderness and no frontal sinus tenderness.  Mouth/Throat: No posterior oropharyngeal edema or posterior oropharyngeal erythema.  Cardiovascular: Normal rate, regular rhythm and normal heart sounds.   Pulmonary/Chest: Effort normal and breath sounds normal. No respiratory distress. She has no wheezes. She has no rales.  Musculoskeletal: Normal range of motion.  Lymphadenopathy:       Head (right side): No submandibular adenopathy present.       Head (left side): No submandibular adenopathy present.    She has no cervical adenopathy.  Neurological: She is alert and oriented to person, place, and time.  Skin: Skin is warm and dry. No rash noted.  Psychiatric: She has a normal mood and affect. Her behavior is normal. Thought content normal.  Nursing note and vitals reviewed.   BP 126/82   Pulse 84   Temp 98 F (36.7 C)   Ht 5\' 5"  (1.651 m)   Wt 184 lb (83.5 kg)   SpO2 99%   BMI 30.62 kg/m   Assessment and Plan: 1. Viral URI Following bronchitis - now resolved Zyrtec 10 mg once a day or Astelin if needed - azelastine (ASTELIN) 0.1 % nasal spray; Place 2 sprays into both nostrils 2 (two) times daily. Use in each nostril as directed  Dispense: 30 mL; Refill: [place], MD [facility]  10/31/2016

## 2016-12-05 ENCOUNTER — Other Ambulatory Visit: Payer: Self-pay | Admitting: Internal Medicine

## 2016-12-05 DIAGNOSIS — I10 Essential (primary) hypertension: Secondary | ICD-10-CM

## 2017-02-11 ENCOUNTER — Encounter: Payer: Self-pay | Admitting: Internal Medicine

## 2017-02-11 ENCOUNTER — Ambulatory Visit
Admission: RE | Admit: 2017-02-11 | Discharge: 2017-02-11 | Disposition: A | Discharge status: Home / Self Care | Payer: 59 | Source: Ambulatory Visit | Attending: Internal Medicine | Admitting: Internal Medicine

## 2017-02-11 ENCOUNTER — Ambulatory Visit (INDEPENDENT_AMBULATORY_CARE_PROVIDER_SITE_OTHER): Payer: 59 | Admitting: Internal Medicine

## 2017-02-11 VITALS — BP 142/80 | HR 80 | Ht 65.0 in | Wt 185.3 lb

## 2017-02-11 DIAGNOSIS — R202 Paresthesia of skin: Secondary | ICD-10-CM

## 2017-02-11 DIAGNOSIS — R0602 Shortness of breath: Secondary | ICD-10-CM

## 2017-02-11 DIAGNOSIS — R200 Anesthesia of skin: Secondary | ICD-10-CM | POA: Diagnosis not present

## 2017-02-11 DIAGNOSIS — K3189 Other diseases of stomach and duodenum: Secondary | ICD-10-CM | POA: Insufficient documentation

## 2017-02-11 DIAGNOSIS — I10 Essential (primary) hypertension: Secondary | ICD-10-CM

## 2017-02-11 DIAGNOSIS — K31A0 Gastric intestinal metaplasia, unspecified: Secondary | ICD-10-CM | POA: Insufficient documentation

## 2017-02-11 MED ORDER — DOXYCYCLINE HYCLATE 100 MG PO TABS: 100.0000 mg | ORAL_TABLET | Freq: Two times a day (BID) | ORAL | 0 refills | Status: DC

## 2017-02-11 NOTE — Progress Notes (Signed)
Date:  02/11/2017   Name:  Jacqueline Harmon   DOB:  12-May-1956   MRN:  [id number]   Chief Complaint: Numbness (Feeling running down right arm and both feet almost feel numb.- started few days ago. Cramping in behind shin started about a week ago. Waking up in middle of night. Feeling lightheaded, SOB since last couple of days, ) Shortness of Breath  This is a new problem. The current episode started in the past 7 days. The problem occurs every few minutes. Associated symptoms include rhinorrhea and vomiting. Pertinent negatives include no abdominal pain, chest pain, fever, headaches, leg swelling, PND, sore throat, sputum production or wheezing. The symptoms are aggravated by any activity. Risk factors include smoking.   Numbness - right arm and both feet - new in the past few days.  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 Upper arm to elbow now.  Tingling in great toes - not painful.  Seems worse since bad cramp last week.  Cramps - in both legs one night last week - severe charlie horse.  Since then legs have been very sore.  Today very tired.  Weakness - started today.  Lightheaded when she woke up.  Unable to walk the dog.   One episode of vomiting.  No syncope.  Appetite has been poor but she has tried to drink more water and juice.  Urine is clear without dysuria.   Review of Systems  Constitutional: Positive for fatigue. Negative for chills and fever.  HENT: Positive for postnasal drip and rhinorrhea. Negative for sore throat, tinnitus and trouble swallowing.   Respiratory: Positive for cough and shortness of breath. Negative for sputum production, chest tightness and wheezing.   Cardiovascular: Negative for chest pain, palpitations, leg swelling and PND.  Gastrointestinal: Positive for nausea and vomiting. Negative for abdominal pain.  Genitourinary: Negative for difficulty urinating, dysuria and hematuria.  Musculoskeletal: Negative for arthralgias and back pain.    Neurological: Positive for weakness, light-headedness and numbness. Negative for dizziness, syncope and headaches.  Psychiatric/Behavioral: Negative for sleep disturbance.    Patient Active Problem List   Diagnosis Date Noted  . Intestinal metaplasia of gastric mucosa 02/11/2017  . Gastritis without bleeding   . Adenomatous colon polyp   . Hyperlipidemia 06/10/2015  . Smokes tobacco daily 03/23/2015  . Essential (primary) hypertension 03/23/2015  . Bilateral fibrocystic breast disease 03/23/2015  . Acid reflux 03/23/2015  . Hot flash, menopausal 03/23/2015  . Migraine without aura and responsive to treatment 03/23/2015  . 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 Authorizing Provider  albuterol (PROVENTIL HFA;VENTOLIN HFA) 108 (90 Base) MCG/ACT inhaler Inhale 2 puffs into the lungs every 6 (six) hours as needed for wheezing or shortness of breath. 10/12/16  Yes Glean Hess, MD  atorvastatin (LIPITOR) 10 MG tablet TAKE 1 TABLET (10 MG TOTAL) BY MOUTH DAILY. 09/06/16  Yes Glean Hess, MD  Butalbital-APAP-Caffeine 50-300-40 MG CAPS Take 1 capsule by mouth 2 (two) times daily as needed. migraine 03/23/15  Yes Glean Hess, MD  cyclobenzaprine (FLEXERIL) 10 MG tablet Take 1 tablet by mouth at bedtime.   Yes [provider]  dexlansoprazole (DEXILANT) 60 MG capsule Take 60 mg by mouth daily.   Yes [provider]  guaiFENesin-codeine (ROBITUSSIN AC) 100-10 MG/5ML syrup Take 5 mLs by mouth 3 (three) times daily as needed for cough. 10/12/16  Yes Glean Hess,  MD  irbesartan (AVAPRO) 150 MG tablet TAKE 1 TABLET (150 MG TOTAL) BY MOUTH DAILY. 12/05/16  Yes Glean Hess, MD  spironolactone (ALDACTONE) 50 MG tablet TAKE 1 TABLET (50 MG TOTAL) BY MOUTH DAILY. 10/06/16  Yes Glean Hess, MD  topiramate (TOPAMAX) 25 MG tablet TAKE 2 TABLETS (50 MG TOTAL) BY MOUTH AT BEDTIME. 12/05/16  Yes  Glean Hess, MD    Allergies  Allergen Reactions  . Ace Inhibitors Cough  . Levofloxacin     myalgia, constipation  . Prednisone Itching    Past Surgical History:  Procedure Laterality Date  . BREAST BIOPSY Left 08/2014   papilloma  . COLONOSCOPY WITH PROPOFOL N/A 08/02/2016   Procedure: COLONOSCOPY WITH PROPOFOL;  Surgeon: [name], MD;  Location: [place];  Service: Endoscopy;  Laterality: N/A;  . ESOPHAGEAL DILATION  08/02/2016   Procedure: ESOPHAGEAL DILATION;  Surgeon: [name], MD;  Location: [place];  Service: Endoscopy;;  . ESOPHAGOGASTRODUODENOSCOPY (EGD) WITH PROPOFOL N/A 08/02/2016   Repeat EGD 08/2019. Performed by Dr. [name]    Social History  Substance Use Topics  . Smoking status: Current Every Day Smoker    Packs/day: 0.50    Years: 20.00    Types: Cigarettes  . Smokeless tobacco: Never Used  . Alcohol use 1.2 oz/week    2 Standard drinks or equivalent per week     Comment: occasionally     Medication list has been reviewed and updated.   Physical Exam  Constitutional: She is oriented to person, place, and time. She appears well-developed. She has a sickly appearance. No distress.  HENT:  Head: Normocephalic and atraumatic.  Neck: Normal range of motion. Carotid bruit is not present.  Cardiovascular: Normal rate, regular rhythm, S1 normal, normal heart sounds and intact distal pulses.   No extrasystoles are present.  Pulmonary/Chest: Effort normal. No respiratory distress. She has decreased breath sounds. She has no wheezes. She has no rhonchi.  Abdominal: Soft. Normal appearance and bowel sounds are normal. There is no tenderness.  Musculoskeletal: Normal range of motion.       Right shoulder: She exhibits normal range of motion and no tenderness.       Left shoulder: She exhibits normal range of motion and no tenderness.  Calf muscles tender - no cord or homans sign No edema of LE Spasm noted in both  trapezius muscles   Neurological: She is alert and oriented to person, place, and time. She has normal strength and normal reflexes. A sensory deficit (subjective decrease in light touch on top of both great toes) is present.  Skin: Skin is warm and dry. No rash noted.  Psychiatric: Her speech is normal and behavior is normal. Thought content normal. Her mood appears anxious.  Nursing note and vitals reviewed.   BP (!) 142/80 (BP Location: Right Arm, Patient Position: Sitting, Cuff Size: Normal)   Pulse 80   Ht 5\' 5"  (1.651 m)   Wt 185 lb 4.8 oz (84.1 kg)   SpO2 100%   BMI 30.84 kg/m   Assessment and Plan: 1. Shortness of breath Normal vital signs Concern for atypical chest infection vs  COPD Instructed to go to ER if sx worsen - doxycycline (VIBRA-TABS) 100 MG tablet; Take 1 tablet (100 mg total) by mouth 2 (two) times daily.  Dispense: 20 tablet; Refill: 0 - CBC with Differential/Platelet - Comprehensive metabolic panel - DG Chest 2 View; Future  2. Essential (primary) hypertension controlled - TSH  3. Numbness and tingling of both feet Rule out B12 def - Vitamin B12   Meds ordered this encounter  Medications  . doxycycline (VIBRA-TABS) 100 MG tablet    Sig: Take 1 tablet (100 mg total) by mouth 2 (two) times daily.    Dispense:  20 tablet    Refill:  0    Halina Maidens, MD [facility]  02/11/2017

## 2017-02-12 LAB — COMPREHENSIVE METABOLIC PANEL
ALBUMIN: 4.1 g/dL (ref 3.5–5.5)
ALK PHOS: 97 IU/L (ref 39–117)
ALT: 26 IU/L (ref 0–32)
AST: 30 IU/L (ref 0–40)
Albumin/Globulin Ratio: 1.4 (ref 1.2–2.2)
BUN / CREAT RATIO: 11 (ref 9–23)
BUN: 13 mg/dL (ref 6–24)
CHLORIDE: 102 mmol/L (ref 96–106)
CO2: 24 mmol/L (ref 18–29)
CREATININE: 1.15 mg/dL — AB (ref 0.57–1.00)
Calcium: 9.6 mg/dL (ref 8.7–10.2)
GFR calc non Af Amer: 52 mL/min/{1.73_m2} — ABNORMAL LOW (ref >59)
GFR, EST AFRICAN AMERICAN: 60 mL/min/{1.73_m2} (ref >59)
GLOBULIN, TOTAL: 3 g/dL (ref 1.5–4.5)
Glucose: 66 mg/dL (ref 65–99)
Potassium: 4.7 mmol/L (ref 3.5–5.2)
SODIUM: 141 mmol/L (ref 134–144)
TOTAL PROTEIN: 7.1 g/dL (ref 6.0–8.5)

## 2017-02-12 LAB — CBC WITH DIFFERENTIAL/PLATELET
BASOS: 0 %
Basophils Absolute: 0.1 10*3/uL (ref 0.0–0.2)
EOS (ABSOLUTE): 0.4 10*3/uL (ref 0.0–0.4)
EOS: 3 %
HEMATOCRIT: 38 % (ref 34.0–46.6)
HEMOGLOBIN: 12.4 g/dL (ref 11.1–15.9)
IMMATURE GRANS (ABS): 0 10*3/uL (ref 0.0–0.1)
Immature Granulocytes: 0 %
LYMPHS ABS: 6 10*3/uL — AB (ref 0.7–3.1)
LYMPHS: 44 %
MCH: 27.3 pg (ref 26.6–33.0)
MCHC: 32.6 g/dL (ref 31.5–35.7)
MCV: 84 fL (ref 79–97)
Monocytes Absolute: 1 10*3/uL — ABNORMAL HIGH (ref 0.1–0.9)
Monocytes: 7 %
NEUTROS ABS: 6.1 10*3/uL (ref 1.4–7.0)
Neutrophils: 46 %
Platelets: 456 10*3/uL — ABNORMAL HIGH (ref 150–379)
RBC: 4.55 x10E6/uL (ref 3.77–5.28)
RDW: 15.4 % (ref 12.3–15.4)
WBC: 13.5 10*3/uL — ABNORMAL HIGH (ref 3.4–10.8)

## 2017-02-12 LAB — TSH: TSH: 1.46 u[IU]/mL (ref 0.450–4.500)

## 2017-02-12 LAB — VITAMIN B12: Vitamin B-12: 702 pg/mL (ref 232–1245)

## 2017-03-09 ENCOUNTER — Other Ambulatory Visit: Payer: Self-pay | Admitting: Internal Medicine

## 2017-03-29 ENCOUNTER — Ambulatory Visit (INDEPENDENT_AMBULATORY_CARE_PROVIDER_SITE_OTHER): Payer: 59 | Admitting: Internal Medicine

## 2017-03-29 ENCOUNTER — Encounter: Payer: Self-pay | Admitting: Internal Medicine

## 2017-03-29 VITALS — BP 142/68 | HR 93 | Ht 65.0 in | Wt 183.0 lb

## 2017-03-29 DIAGNOSIS — S39012A Strain of muscle, fascia and tendon of lower back, initial encounter: Secondary | ICD-10-CM

## 2017-03-29 MED ORDER — MELOXICAM 15 MG PO TABS: 15.0000 mg | ORAL_TABLET | Freq: Every day | ORAL | 0 refills | Status: DC

## 2017-03-29 NOTE — Patient Instructions (Signed)
Heat or Ice 20 minutes three times a day

## 2017-03-29 NOTE — Progress Notes (Signed)
Date:  03/29/2017   Name:  Jacqueline Harmon   DOB:  04/15/1956   MRN:  [id number]   Chief Complaint: Back Pain (Started week and half ago. Had several back surgeries prior. Cannot stand up straight. Doing a lot of work around house. Don't like taking flexeril but been taking half pill since monday and not getting better. For last 3 days its been dull and throbbing. Pain is mainly in right hip radiating to front. ) Back Pain  This is a new problem. The current episode started in the past 7 days. The problem has been waxing and waning since onset. The pain is present in the sacro-iliac. The pain does not radiate. The pain is mild. The symptoms are aggravated by twisting and bending. Pertinent negatives include no bladder incontinence, bowel incontinence, chest pain, numbness or weakness. She has tried bed rest for the symptoms. The treatment provided mild relief.      Review of Systems  Constitutional: Negative for chills and fatigue.  Eyes: Negative for visual disturbance.  Respiratory: Negative for chest tightness and shortness of breath.   Cardiovascular: Negative for chest pain and palpitations.  Gastrointestinal: Negative for bowel incontinence.  Genitourinary: Negative for bladder incontinence.  Musculoskeletal: Positive for back pain. Negative for myalgias.  Neurological: Negative for weakness and numbness.    Patient Active Problem List   Diagnosis Date Noted  . Intestinal metaplasia of gastric mucosa 02/11/2017  . Gastritis without bleeding   . Adenomatous colon polyp   . Hyperlipidemia 06/10/2015  . Smokes tobacco daily 03/23/2015  . Essential (primary) hypertension 03/23/2015  . Bilateral fibrocystic breast disease 03/23/2015  . Acid reflux 03/23/2015  . Hot flash, menopausal 03/23/2015  . Migraine without aura and responsive to treatment 03/23/2015  . 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 Authorizing  Provider  albuterol (PROVENTIL HFA;VENTOLIN HFA) 108 (90 Base) MCG/ACT inhaler Inhale 2 puffs into the lungs every 6 (six) hours as needed for wheezing or shortness of breath. 10/12/16  Yes Glean Hess, MD  atorvastatin (LIPITOR) 10 MG tablet TAKE 1 TABLET (10 MG TOTAL) BY MOUTH DAILY. 03/10/17  Yes Glean Hess, MD  Butalbital-APAP-Caffeine 50-300-40 MG CAPS Take 1 capsule by mouth 2 (two) times daily as needed. migraine 03/23/15  Yes Glean Hess, MD  cyclobenzaprine (FLEXERIL) 10 MG tablet Take 1 tablet by mouth at bedtime.   Yes [provider]  dexlansoprazole (DEXILANT) 60 MG capsule Take 60 mg by mouth daily.   Yes [provider]  irbesartan (AVAPRO) 150 MG tablet TAKE 1 TABLET (150 MG TOTAL) BY MOUTH DAILY. 12/05/16  Yes Glean Hess, MD  spironolactone (ALDACTONE) 50 MG tablet TAKE 1 TABLET (50 MG TOTAL) BY MOUTH DAILY. 10/06/16  Yes Glean Hess, MD  topiramate (TOPAMAX) 25 MG tablet TAKE 2 TABLETS (50 MG TOTAL) BY MOUTH AT BEDTIME. 12/05/16  Yes Glean Hess, MD    Allergies  Allergen Reactions  . Ace Inhibitors Cough  . Levofloxacin     myalgia, constipation  . Prednisone Itching    Past Surgical History:  Procedure Laterality Date  . BREAST BIOPSY Left 08/2014   papilloma  . COLONOSCOPY WITH PROPOFOL N/A 08/02/2016   Procedure: COLONOSCOPY WITH PROPOFOL;  Surgeon: [name], MD;  Location: [place];  Service: Endoscopy;  Laterality: N/A;  . ESOPHAGEAL DILATION  08/02/2016   Procedure: ESOPHAGEAL DILATION;  Surgeon: [name], MD;  Location: [place];  Service: Endoscopy;;  . ESOPHAGOGASTRODUODENOSCOPY (EGD) WITH PROPOFOL N/A 08/02/2016   Repeat EGD 08/2019. Performed by Dr. [name]    Social History  Substance Use Topics  . Smoking status: Current Every Day Smoker    Packs/day: 0.50    Years: 20.00    Types: Cigarettes  . Smokeless tobacco: Never Used  . Alcohol use 1.2 oz/week    2 Standard  drinks or equivalent per week     Comment: occasionally     Medication list has been reviewed and updated.   Physical Exam  Constitutional: She is oriented to person, place, and time. She appears well-developed. No distress.  HENT:  Head: Normocephalic and atraumatic.  Cardiovascular: Normal rate, regular rhythm and normal heart sounds.   Pulmonary/Chest: Effort normal and breath sounds normal. No respiratory distress.  Abdominal: There is no CVA tenderness.  Musculoskeletal: Normal range of motion.       Right hip: Normal.       Left hip: Normal.       Lumbar back: She exhibits no tenderness and no spasm.  Tender over right SI joint  Neurological: She is alert and oriented to person, place, and time. She has normal reflexes.  Skin: Skin is warm and dry. No rash noted.  Psychiatric: She has a normal mood and affect. Her behavior is normal. Thought content normal.  Nursing note and vitals reviewed.   BP (!) 142/68   Pulse 93   Ht 5\' 5"  (1.651 m)   Wt 183 lb (83 kg)   SpO2 99%   BMI 30.45 kg/m   Assessment and Plan: 1. Sacroiliac strain, initial encounter Begin Mobic daily Ice or heat three times per day - meloxicam (MOBIC) 15 MG tablet; Take 1 tablet (15 mg total) by mouth daily.  Dispense: 30 tablet; Refill: 0   Meds ordered this encounter  Medications  . meloxicam (MOBIC) 15 MG tablet    Sig: Take 1 tablet (15 mg total) by mouth daily.    Dispense:  30 tablet    Refill:  0    Halina Maidens, MD [facility]  03/29/2017

## 2017-06-10 ENCOUNTER — Other Ambulatory Visit: Payer: Self-pay | Admitting: Internal Medicine

## 2017-06-10 DIAGNOSIS — I10 Essential (primary) hypertension: Secondary | ICD-10-CM

## 2017-06-14 ENCOUNTER — Encounter: Payer: Self-pay | Admitting: Internal Medicine

## 2017-06-14 ENCOUNTER — Ambulatory Visit (INDEPENDENT_AMBULATORY_CARE_PROVIDER_SITE_OTHER): Payer: 59 | Admitting: Internal Medicine

## 2017-06-14 VITALS — BP 124/70 | HR 84 | Ht 65.0 in | Wt 177.8 lb

## 2017-06-14 DIAGNOSIS — E782 Mixed hyperlipidemia: Secondary | ICD-10-CM

## 2017-06-14 DIAGNOSIS — F419 Anxiety disorder, unspecified: Secondary | ICD-10-CM

## 2017-06-14 DIAGNOSIS — K2100 Gastro-esophageal reflux disease with esophagitis, without bleeding: Secondary | ICD-10-CM | POA: Insufficient documentation

## 2017-06-14 DIAGNOSIS — Z0000 Encounter for general adult medical examination without abnormal findings: Secondary | ICD-10-CM

## 2017-06-14 DIAGNOSIS — Z1239 Encounter for other screening for malignant neoplasm of breast: Secondary | ICD-10-CM

## 2017-06-14 DIAGNOSIS — I10 Essential (primary) hypertension: Secondary | ICD-10-CM | POA: Diagnosis not present

## 2017-06-14 LAB — POCT URINALYSIS DIPSTICK
BILIRUBIN UA: NEGATIVE
Blood, UA: NEGATIVE
GLUCOSE UA: NEGATIVE
Ketones, UA: NEGATIVE
NITRITE UA: NEGATIVE
UROBILINOGEN UA: 0.2 U/dL
pH, UA: 5 (ref 5.0–8.0)

## 2017-06-14 MED ORDER — DEXLANSOPRAZOLE 60 MG PO CPDR: 60.0000 mg | DELAYED_RELEASE_CAPSULE | Freq: Every day | ORAL | 3 refills | Status: DC

## 2017-06-14 MED ORDER — BUPROPION HCL ER (SR) 150 MG PO TB12: 150.0000 mg | ORAL_TABLET | Freq: Two times a day (BID) | ORAL | 5 refills | Status: DC

## 2017-06-14 NOTE — Progress Notes (Signed)
Date:  06/14/2017   Name:  Jacqueline Harmon   DOB:  05-26-1956   MRN:  [id number]   Chief Complaint: Annual Exam (Breast Exam. ) and Anxiety (Scored 21 on GAD7 and PHQ9- 0. Had to put down dog she had for 12 years- its been almost a month ago. Still wondering if she did the "right thing" ) Jacqueline Harmon is a 61 y.o. female who presents today for her Complete Annual Exam. She feels fairly well. She reports exercising walking. She reports she is sleeping fairly well.   Anxiety  Presents for initial visit. Onset was 1 to 4 weeks ago. Symptoms include nausea (occaisonal vomiting), nervous/anxious behavior and palpitations. Patient reports no chest pain, dizziness, shortness of breath or suicidal ideas. Symptoms occur most days. The symptoms are aggravated by family issues.    Hypertension  This is a chronic problem. The problem is controlled. Associated symptoms include anxiety, headaches and palpitations. Pertinent negatives include no chest pain or shortness of breath. Past treatments include angiotensin blockers.  Hyperlipidemia  Pertinent negatives include no chest pain or shortness of breath.  Gastroesophageal Reflux  She complains of heartburn and nausea (occaisonal vomiting). She reports no abdominal pain, no chest pain, no coughing or no wheezing. This is a recurrent problem. The problem occurs frequently. The problem has been unchanged. The symptoms are aggravated by smoking and certain foods. Pertinent negatives include no fatigue. Risk factors include smoking/tobacco exposure and hiatal hernia (esophagitis, HH, eso stricture and gastritis). She has tried a PPI (was told to take daily but stopped) for the symptoms. Past procedures include an EGD.  Tobacco use - smokes cigarettes but is very interested in quitting. She does use bupropion in the past with good benefit. It may also help her anxiety.   Review of Systems  Constitutional: Negative for chills, fatigue and fever.  HENT:  Negative for congestion, hearing loss, tinnitus, trouble swallowing and voice change.   Eyes: Negative for visual disturbance.  Respiratory: Negative for cough, chest tightness, shortness of breath and wheezing.   Cardiovascular: Positive for palpitations. Negative for chest pain and leg swelling.  Gastrointestinal: Positive for heartburn and nausea (occaisonal vomiting). Negative for abdominal pain, constipation, diarrhea and vomiting.  Endocrine: Negative for polydipsia and polyuria.  Genitourinary: Negative for dysuria, frequency, genital sores, vaginal bleeding and vaginal discharge.  Musculoskeletal: Negative for arthralgias, gait problem and joint swelling.  Skin: Negative for color change and rash.  Neurological: Positive for headaches. Negative for dizziness, tremors and light-headedness.  Hematological: Negative for adenopathy. Does not bruise/bleed easily.  Psychiatric/Behavioral: Positive for dysphoric mood. Negative for agitation, hallucinations, sleep disturbance and suicidal ideas. The patient is nervous/anxious.     Patient Active Problem List   Diagnosis Date Noted  . Intestinal metaplasia of gastric mucosa 02/11/2017  . Gastritis without bleeding   . Adenomatous colon polyp   . Hyperlipidemia 06/10/2015  . Smokes tobacco daily 03/23/2015  . Essential (primary) hypertension 03/23/2015  . Bilateral fibrocystic breast disease 03/23/2015  . Acid reflux 03/23/2015  . Hot flash, menopausal 03/23/2015  . Migraine without aura and responsive to treatment 03/23/2015  . 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 Authorizing Provider  albuterol (PROVENTIL HFA;VENTOLIN HFA) 108 (90 Base) MCG/ACT inhaler Inhale 2 puffs into the lungs every 6 (six) hours as needed for wheezing or shortness of breath. 10/12/16  Yes Glean Hess, MD  atorvastatin (LIPITOR) 10 MG tablet  TAKE 1 TABLET (10 MG TOTAL) BY MOUTH DAILY. 03/10/17   Yes Glean Hess, MD  Butalbital-APAP-Caffeine 50-300-40 MG CAPS Take 1 capsule by mouth 2 (two) times daily as needed. migraine 03/23/15  Yes Glean Hess, MD  irbesartan (AVAPRO) 150 MG tablet TAKE 1 TABLET (150 MG TOTAL) BY MOUTH DAILY. 06/10/17  Yes Glean Hess, MD  spironolactone (ALDACTONE) 50 MG tablet TAKE 1 TABLET (50 MG TOTAL) BY MOUTH DAILY. 10/06/16  Yes Glean Hess, MD  topiramate (TOPAMAX) 25 MG tablet TAKE 2 TABLETS (50 MG TOTAL) BY MOUTH AT BEDTIME. 06/10/17  Yes Glean Hess, MD    Allergies  Allergen Reactions  . Ace Inhibitors Cough  . Levofloxacin     myalgia, constipation  . Prednisone Itching    Past Surgical History:  Procedure Laterality Date  . ANTERIOR CERVICAL DISCECTOMY  2010  . BREAST BIOPSY Left 08/2014   papilloma  . COLONOSCOPY WITH PROPOFOL N/A 08/02/2016   Procedure: COLONOSCOPY WITH PROPOFOL;  Surgeon: [name], MD;  Location: [facility];  Service: Endoscopy;  Laterality: N/A;  . ENDOSCOPIC HEMILAMINOTOMY W/ DISCECTOMY LUMBAR  2009, 2011  . ESOPHAGEAL DILATION  08/02/2016   Procedure: ESOPHAGEAL DILATION;  Surgeon: [name], MD;  Location: [facility];  Service: Endoscopy;;  . ESOPHAGOGASTRODUODENOSCOPY (EGD) WITH PROPOFOL N/A 08/02/2016   Repeat EGD 08/2019. Performed by Dr. [name]    Social History  Substance Use Topics  . Smoking status: Current Every Day Smoker    Packs/day: 0.50    Years: 20.00    Types: Cigarettes  . Smokeless tobacco: Never Used  . Alcohol use 1.2 oz/week    2 Standard drinks or equivalent per week     Comment: occasionally   GAD 7 : Generalized Anxiety Score 06/14/2017  Nervous, Anxious, on Edge 3  Control/stop worrying 3  Worry too much - different things 3  Trouble relaxing 3  Restless 3  Easily annoyed or irritable 3  Afraid - awful might happen 3  Total GAD 7 Score 21  Anxiety Difficulty Extremely difficult   Medication list has been reviewed and  updated.  PHQ 2/9 Scores 06/14/2017 06/12/2016  PHQ - 2 Score 0 0    Physical Exam  Constitutional: She is oriented to person, place, and time. She appears well-developed and well-nourished. No distress.  HENT:  Head: Normocephalic and atraumatic.  Right Ear: Tympanic membrane and ear canal normal.  Left Ear: Tympanic membrane and ear canal normal.  Nose: Right sinus exhibits no maxillary sinus tenderness. Left sinus exhibits no maxillary sinus tenderness.  Mouth/Throat: Uvula is midline and oropharynx is clear and moist.  Eyes: Conjunctivae and EOM are normal. Right eye exhibits no discharge. Left eye exhibits no discharge. No scleral icterus.  Neck: Normal range of motion. Carotid bruit is not present. No erythema present. No thyromegaly present.  Cardiovascular: Normal rate, regular rhythm, normal heart sounds and normal pulses.   Pulmonary/Chest: Effort normal. No respiratory distress. She has no wheezes. Right breast exhibits no mass, no nipple discharge, no skin change and no tenderness. Left breast exhibits no mass, no nipple discharge, no skin change and no tenderness.  Abdominal: Soft. Bowel sounds are normal. There is no hepatosplenomegaly. There is tenderness in the epigastric area. There is no rigidity, no guarding and no CVA tenderness.  Musculoskeletal: Normal range of motion.  Lymphadenopathy:    She has no cervical adenopathy.    She has no axillary adenopathy.  Neurological: She is  alert and oriented to person, place, and time. She has normal reflexes. No cranial nerve deficit or sensory deficit.  Skin: Skin is warm, dry and intact. No rash noted.  Psychiatric: Her speech is normal and behavior is normal. Thought content normal. Her mood appears anxious. She exhibits a depressed mood.  Nursing note and vitals reviewed.   BP 124/70   Pulse 84   Ht 5\' 5"  (1.651 m)   Wt 177 lb 12.8 oz (80.6 kg)   SpO2 99%   BMI 29.59 kg/m   Assessment and Plan: 1. Annual physical  exam Pap up to date last year - POCT urinalysis dipstick  2. Breast cancer screening Schedule at [facility]; Future  3. Essential (primary) hypertension Controlled; EKG normal - EKG 12-Lead - Comprehensive metabolic panel - TSH  4. Mixed hyperlipidemia Continue statin therapy - Lipid panel  5. Gastroesophageal reflux disease with esophagitis Resume PPI - follow up or call if persistent - CBC with Differential/Platelet - dexlansoprazole (DEXILANT) 60 MG capsule; Take 1 capsule (60 mg total) by mouth daily.  Dispense: 90 capsule; Refill: 3  6. Anxiety disorder, unspecified type Begin bupropion - buPROPion (WELLBUTRIN SR) 150 MG 12 hr tablet; Take 1 tablet (150 mg total) by mouth 2 (two) times daily.  Dispense: 60 tablet; Refill: 5   Meds ordered this encounter  Medications  . buPROPion (WELLBUTRIN SR) 150 MG 12 hr tablet    Sig: Take 1 tablet (150 mg total) by mouth 2 (two) times daily.    Dispense:  60 tablet    Refill:  5  . dexlansoprazole (DEXILANT) 60 MG capsule    Sig: Take 1 capsule (60 mg total) by mouth daily.    Dispense:  90 capsule    Refill:  3    Partially dictated using Editor, commissioning. Any errors are unintentional.  Halina Maidens, MD [facility]  06/14/2017

## 2017-06-15 LAB — COMPREHENSIVE METABOLIC PANEL
ALK PHOS: 100 IU/L (ref 39–117)
ALT: 24 IU/L (ref 0–32)
AST: 18 IU/L (ref 0–40)
Albumin/Globulin Ratio: 1.6 (ref 1.2–2.2)
Albumin: 4.4 g/dL (ref 3.6–4.8)
BUN/Creatinine Ratio: 8 — ABNORMAL LOW (ref 12–28)
BUN: 9 mg/dL (ref 8–27)
CHLORIDE: 103 mmol/L (ref 96–106)
CO2: 22 mmol/L (ref 20–29)
Calcium: 9.6 mg/dL (ref 8.7–10.3)
Creatinine, Ser: 1.15 mg/dL — ABNORMAL HIGH (ref 0.57–1.00)
GFR calc Af Amer: 60 mL/min/{1.73_m2} (ref >59)
GFR calc non Af Amer: 52 mL/min/{1.73_m2} — ABNORMAL LOW (ref >59)
GLUCOSE: 71 mg/dL (ref 65–99)
Globulin, Total: 2.8 g/dL (ref 1.5–4.5)
Potassium: 5 mmol/L (ref 3.5–5.2)
Sodium: 139 mmol/L (ref 134–144)
Total Protein: 7.2 g/dL (ref 6.0–8.5)

## 2017-06-15 LAB — LIPID PANEL
CHOL/HDL RATIO: 2.6 ratio (ref 0.0–4.4)
CHOLESTEROL TOTAL: 153 mg/dL (ref 100–199)
HDL: 58 mg/dL (ref >39)
LDL Calculated: 70 mg/dL (ref 0–99)
TRIGLYCERIDES: 125 mg/dL (ref 0–149)
VLDL CHOLESTEROL CAL: 25 mg/dL (ref 5–40)

## 2017-06-15 LAB — CBC WITH DIFFERENTIAL/PLATELET
BASOS ABS: 0 10*3/uL (ref 0.0–0.2)
Basos: 0 %
EOS (ABSOLUTE): 0.1 10*3/uL (ref 0.0–0.4)
Eos: 1 %
Hematocrit: 40.9 % (ref 34.0–46.6)
Hemoglobin: 12.9 g/dL (ref 11.1–15.9)
Immature Grans (Abs): 0 10*3/uL (ref 0.0–0.1)
Immature Granulocytes: 0 %
LYMPHS: 33 %
Lymphocytes Absolute: 3.4 10*3/uL — ABNORMAL HIGH (ref 0.7–3.1)
MCH: 27.2 pg (ref 26.6–33.0)
MCHC: 31.5 g/dL (ref 31.5–35.7)
MCV: 86 fL (ref 79–97)
Monocytes Absolute: 0.7 10*3/uL (ref 0.1–0.9)
Monocytes: 7 %
NEUTROS ABS: 6.1 10*3/uL (ref 1.4–7.0)
Neutrophils: 59 %
PLATELETS: 484 10*3/uL — AB (ref 150–379)
RBC: 4.75 x10E6/uL (ref 3.77–5.28)
RDW: 16.8 % — ABNORMAL HIGH (ref 12.3–15.4)
WBC: 10.4 10*3/uL (ref 3.4–10.8)

## 2017-06-15 LAB — TSH: TSH: 1.22 u[IU]/mL (ref 0.450–4.500)

## 2017-07-18 ENCOUNTER — Other Ambulatory Visit: Payer: Self-pay | Admitting: Internal Medicine

## 2017-07-18 DIAGNOSIS — Z1239 Encounter for other screening for malignant neoplasm of breast: Secondary | ICD-10-CM

## 2017-07-18 DIAGNOSIS — D242 Benign neoplasm of left breast: Secondary | ICD-10-CM

## 2017-07-24 ENCOUNTER — Encounter: Payer: Self-pay | Admitting: Emergency Medicine

## 2017-07-24 ENCOUNTER — Ambulatory Visit (INDEPENDENT_AMBULATORY_CARE_PROVIDER_SITE_OTHER): Payer: 59 | Admitting: Internal Medicine

## 2017-07-24 ENCOUNTER — Emergency Department
Admission: EM | Admit: 2017-07-24 | Discharge: 2017-07-24 | Disposition: A | Discharge status: Home / Self Care | Payer: 59 | Attending: Emergency Medicine | Admitting: Emergency Medicine

## 2017-07-24 ENCOUNTER — Encounter: Payer: Self-pay | Admitting: Internal Medicine

## 2017-07-24 ENCOUNTER — Other Ambulatory Visit
Admission: RE | Admit: 2017-07-24 | Discharge: 2017-07-24 | Disposition: A | Discharge status: Home / Self Care | Payer: 59 | Source: Ambulatory Visit | Attending: Internal Medicine | Admitting: Internal Medicine

## 2017-07-24 VITALS — BP 104/60 | HR 77 | Ht 65.0 in | Wt 178.0 lb

## 2017-07-24 DIAGNOSIS — Z79899 Other long term (current) drug therapy: Secondary | ICD-10-CM | POA: Insufficient documentation

## 2017-07-24 DIAGNOSIS — F17210 Nicotine dependence, cigarettes, uncomplicated: Secondary | ICD-10-CM | POA: Insufficient documentation

## 2017-07-24 DIAGNOSIS — K921 Melena: Secondary | ICD-10-CM | POA: Insufficient documentation

## 2017-07-24 DIAGNOSIS — K2100 Gastro-esophageal reflux disease with esophagitis, without bleeding: Secondary | ICD-10-CM

## 2017-07-24 DIAGNOSIS — I10 Essential (primary) hypertension: Secondary | ICD-10-CM | POA: Diagnosis not present

## 2017-07-24 DIAGNOSIS — K625 Hemorrhage of anus and rectum: Secondary | ICD-10-CM | POA: Diagnosis present

## 2017-07-24 LAB — COMPREHENSIVE METABOLIC PANEL
ALBUMIN: 3.8 g/dL (ref 3.5–5.0)
ALK PHOS: 83 U/L (ref 38–126)
ALT: 21 U/L (ref 14–54)
AST: 22 U/L (ref 15–41)
Anion gap: 7 (ref 5–15)
BILIRUBIN TOTAL: 0.2 mg/dL — AB (ref 0.3–1.2)
BUN: 18 mg/dL (ref 6–20)
CALCIUM: 9.2 mg/dL (ref 8.9–10.3)
CO2: 24 mmol/L (ref 22–32)
CREATININE: 1.25 mg/dL — AB (ref 0.44–1.00)
Chloride: 107 mmol/L (ref 101–111)
GFR calc non Af Amer: 46 mL/min — ABNORMAL LOW (ref >60)
GFR, EST AFRICAN AMERICAN: 53 mL/min — AB (ref >60)
GLUCOSE: 88 mg/dL (ref 65–99)
Potassium: 4.2 mmol/L (ref 3.5–5.1)
SODIUM: 138 mmol/L (ref 135–145)
TOTAL PROTEIN: 7.2 g/dL (ref 6.5–8.1)

## 2017-07-24 LAB — CBC WITH DIFFERENTIAL/PLATELET
Basophils Absolute: 0.1 10*3/uL (ref 0–0.1)
Basophils Relative: 1 %
EOS ABS: 0.2 10*3/uL (ref 0–0.7)
EOS PCT: 2 %
HCT: 32.7 % — ABNORMAL LOW (ref 35.0–47.0)
Hemoglobin: 11 g/dL — ABNORMAL LOW (ref 12.0–16.0)
LYMPHS ABS: 3.5 10*3/uL (ref 1.0–3.6)
LYMPHS PCT: 34 %
MCH: 27.4 pg (ref 26.0–34.0)
MCHC: 33.7 g/dL (ref 32.0–36.0)
MCV: 81.3 fL (ref 80.0–100.0)
MONO ABS: 0.8 10*3/uL (ref 0.2–0.9)
MONOS PCT: 8 %
Neutro Abs: 5.6 10*3/uL (ref 1.4–6.5)
Neutrophils Relative %: 55 %
PLATELETS: 424 10*3/uL (ref 150–440)
RBC: 4.03 MIL/uL (ref 3.80–5.20)
RDW: 15.7 % — ABNORMAL HIGH (ref 11.5–14.5)
WBC: 10.2 10*3/uL (ref 3.6–11.0)

## 2017-07-24 LAB — CBC
HCT: 32.3 % — ABNORMAL LOW (ref 35.0–47.0)
Hemoglobin: 10.9 g/dL — ABNORMAL LOW (ref 12.0–16.0)
MCH: 27.6 pg (ref 26.0–34.0)
MCHC: 33.8 g/dL (ref 32.0–36.0)
MCV: 81.8 fL (ref 80.0–100.0)
PLATELETS: 393 10*3/uL (ref 150–440)
RBC: 3.95 MIL/uL (ref 3.80–5.20)
RDW: 15.7 % — ABNORMAL HIGH (ref 11.5–14.5)
WBC: 9.2 10*3/uL (ref 3.6–11.0)

## 2017-07-24 LAB — TYPE AND SCREEN
ABO/RH(D): O NEG
ANTIBODY SCREEN: NEGATIVE

## 2017-07-24 MED ORDER — FAMOTIDINE 20 MG PO TABS: 20.0000 mg | ORAL_TABLET | Freq: Two times a day (BID) | ORAL | 0 refills | Status: DC

## 2017-07-24 NOTE — ED Triage Notes (Signed)
Pt sent over from PCP for rectal bleeding since yesterday

## 2017-07-24 NOTE — Patient Instructions (Signed)
Dexilant 60 mg daily  Will call you with test results

## 2017-07-24 NOTE — Progress Notes (Signed)
Date:  07/24/2017   Name:  Jacqueline Harmon   DOB:  Mar 14, 1956   MRN:  [id number]   Chief Complaint: Blood In Stools (Started yesterday. Was light blood. Now pooping out blood. Remembers she was told she has internal hemmroids. States she doesn't believe its that, she is scared she is bleeding from inside colon. )  Rectal Bleeding   The current episode started yesterday. The problem occurs frequently. The problem has been rapidly worsening. The patient is experiencing no pain. The stool is described as bloody. Pertinent negatives include no fever, no abdominal pain, no diarrhea and no chest pain.      Review of Systems  Constitutional: Negative for fever.  Respiratory: Negative for chest tightness and shortness of breath.   Cardiovascular: Negative for chest pain.  Gastrointestinal: Positive for anal bleeding, blood in stool and hematochezia. Negative for abdominal pain, constipation and diarrhea.    Patient Active Problem List   Diagnosis Date Noted  . Gastroesophageal reflux disease with esophagitis 06/14/2017  . Intestinal metaplasia of gastric mucosa 02/11/2017  . Adenomatous colon polyp   . Mixed hyperlipidemia 06/10/2015  . Smokes tobacco daily 03/23/2015  . Essential (primary) hypertension 03/23/2015  . Intraductal papilloma of breast, left 03/23/2015  . Hot flash, menopausal 03/23/2015  . Migraine without aura and responsive to treatment 03/23/2015  . 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 Authorizing Provider  albuterol (PROVENTIL HFA;VENTOLIN HFA) 108 (90 Base) MCG/ACT inhaler Inhale 2 puffs into the lungs every 6 (six) hours as needed for wheezing or shortness of breath. 10/12/16  Yes Glean Hess, MD  atorvastatin (LIPITOR) 10 MG tablet TAKE 1 TABLET (10 MG TOTAL) BY MOUTH DAILY. 03/10/17  Yes Glean Hess, MD  buPROPion [facility] SR) 150 MG 12 hr tablet Take 1 tablet (150 mg total) by mouth 2  (two) times daily. 06/14/17  Yes Glean Hess, MD  Butalbital-APAP-Caffeine 50-300-40 MG CAPS Take 1 capsule by mouth 2 (two) times daily as needed. migraine 03/23/15  Yes Glean Hess, MD  irbesartan (AVAPRO) 150 MG tablet TAKE 1 TABLET (150 MG TOTAL) BY MOUTH DAILY. 06/10/17  Yes Glean Hess, MD  spironolactone (ALDACTONE) 50 MG tablet TAKE 1 TABLET (50 MG TOTAL) BY MOUTH DAILY. 10/06/16  Yes Glean Hess, MD  topiramate (TOPAMAX) 25 MG tablet TAKE 2 TABLETS (50 MG TOTAL) BY MOUTH AT BEDTIME. 06/10/17  Yes Glean Hess, MD  dexlansoprazole (DEXILANT) 60 MG capsule Take 1 capsule (60 mg total) by mouth daily. Patient not taking: Reported on 07/24/2017 06/14/17   Glean Hess, MD    Allergies  Allergen Reactions  . Ace Inhibitors Cough  . Levofloxacin     myalgia, constipation  . Prednisone Itching    Past Surgical History:  Procedure Laterality Date  . ANTERIOR CERVICAL DISCECTOMY  2010  . BREAST BIOPSY Left 08/2014   papilloma  . COLONOSCOPY WITH PROPOFOL N/A 08/02/2016   Procedure: COLONOSCOPY WITH PROPOFOL;  Surgeon: [name], MD;  Location: [facility];  Service: Endoscopy;  Laterality: N/A;  . ENDOSCOPIC HEMILAMINOTOMY W/ DISCECTOMY LUMBAR  2009, 2011  . ESOPHAGEAL DILATION  08/02/2016   Procedure: ESOPHAGEAL DILATION;  Surgeon: [name], MD;  Location: [facility];  Service: Endoscopy;;  . ESOPHAGOGASTRODUODENOSCOPY (EGD) WITH PROPOFOL N/A 08/02/2016   Repeat EGD 08/2019. Performed by Dr. [name]    Social History  Substance Use Topics  .  Smoking status: Current Every Day Smoker    Packs/day: 0.50    Years: 20.00    Types: Cigarettes  . Smokeless tobacco: Never Used  . Alcohol use 1.2 oz/week    2 Standard drinks or equivalent per week     Comment: occasionally     Medication list has been reviewed and updated.  PHQ 2/9 Scores 06/14/2017 06/12/2016  PHQ - 2 Score 0 0    Physical Exam  Constitutional: She is oriented  to person, place, and time. She appears well-developed. She appears distressed.  HENT:  Head: Normocephalic and atraumatic.  Cardiovascular: Normal rate, regular rhythm and normal heart sounds.   Pulmonary/Chest: Effort normal and breath sounds normal. No respiratory distress. She has no wheezes.  Abdominal: Soft. Bowel sounds are normal. There is tenderness in the left lower quadrant. There is no rigidity and no guarding.  Currant jelly stool strongly heme positive  Genitourinary: Rectal exam shows guaiac positive stool. Rectal exam shows no external hemorrhoid and no tenderness.  Musculoskeletal: Normal range of motion.  Neurological: She is alert and oriented to person, place, and time.  Skin: Skin is warm and dry. No rash noted.  Psychiatric: She has a normal mood and affect. Her behavior is normal. Thought content normal.  Nursing note and vitals reviewed.   BP 104/60   Pulse 77   Ht 5\' 5"  (1.651 m)   Wt 178 lb (80.7 kg)   SpO2 99%   BMI 29.62 kg/m   Assessment and Plan: 1. Hematochezia Hgb dropped 2 points since last month Normal WBC Advised pt to go to ER for further evaluation - CBC with Differential/Platelet Lab Results  Component Value Date   WBC 10.2 07/24/2017   HGB 11.0 (L) 07/24/2017   HCT 32.7 (L) 07/24/2017   MCV 81.3 07/24/2017   PLT 424 07/24/2017    2. Gastroesophageal reflux disease with esophagitis Dexilant samples given - 1 box   No orders of the defined types were placed in this encounter.   Partially dictated using Editor, commissioning. Any errors are unintentional.  Halina Maidens, MD [facility]  07/24/2017

## 2017-07-24 NOTE — Discharge Instructions (Signed)
Please resume taking your PPI every day and take pepcid twice a day for a full month.  Follow-up with your primary care physician in one week for reevaluation and return to the emergency department sooner for any new or worsening symptoms whatsoever such as worsening pain, fevers, chills, if you feel lightheaded or pass out, or for any other concerns whatsoever.  It was a pleasure to take care of you today, and thank you for coming to our emergency department.  If you have any questions or concerns before leaving please ask the nurse to grab me and I'm more than happy to go through your aftercare instructions again.  If you were prescribed any opioid pain medication today such as Norco, Vicodin, Percocet, morphine, hydrocodone, or oxycodone please make sure you do not drive when you are taking this medication as it can alter your ability to drive safely.  If you have any concerns once you are home that you are not improving or are in fact getting worse before you can make it to your follow-up appointment, please do not hesitate to call 911 and come back for further evaluation.  Darel Hong, MD  Results for orders placed or performed during the hospital encounter of 07/24/17  Comprehensive metabolic panel  Result Value Ref Range   Sodium 138 135 - 145 mmol/L   Potassium 4.2 3.5 - 5.1 mmol/L   Chloride 107 101 - 111 mmol/L   CO2 24 22 - 32 mmol/L   Glucose, Bld 88 65 - 99 mg/dL   BUN 18 6 - 20 mg/dL   Creatinine, Ser 1.25 (H) 0.44 - 1.00 mg/dL   Calcium 9.2 8.9 - 10.3 mg/dL   Total Protein 7.2 6.5 - 8.1 g/dL   Albumin 3.8 3.5 - 5.0 g/dL   AST 22 15 - 41 U/L   ALT 21 14 - 54 U/L   Alkaline Phosphatase 83 38 - 126 U/L   Total Bilirubin 0.2 (L) 0.3 - 1.2 mg/dL   GFR calc non Af Amer 46 (L) >60 mL/min   GFR calc Af Amer 53 (L) >60 mL/min   Anion gap 7 5 - 15  CBC  Result Value Ref Range   WBC 9.2 3.6 - 11.0 K/uL   RBC 3.95 3.80 - 5.20 MIL/uL   Hemoglobin 10.9 (L) 12.0 - 16.0 g/dL   HCT 32.3 (L) 35.0 - 47.0 %   MCV 81.8 80.0 - 100.0 fL   MCH 27.6 26.0 - 34.0 pg   MCHC 33.8 32.0 - 36.0 g/dL   RDW 15.7 (H) 11.5 - 14.5 %   Platelets 393 150 - 440 K/uL  Type and screen Helen  Result Value Ref Range   ABO/RH(D) O NEG    Antibody Screen NEG    Sample Expiration 07/27/2017

## 2017-07-24 NOTE — ED Provider Notes (Signed)
[facility] Emergency Department Provider Note  ____________________________________________   First MD Initiated Contact with Patient 07/24/17 1519     (approximate)  I have reviewed the triage vital signs and the nursing notes.   HISTORY  Chief Complaint Rectal Bleeding    HPI Jacqueline Harmon is a 61 y.o. female history into the emergency department by her primary care physician for 1 day of hematochezia.  she has had painless rectal bleeding that began yesterday. She feels like it is improving. She has had some episodes of dark stools. She does not take iron or Pepto-Bismol. She does have a past medical history of gastritis but has been noncompliant with a proton pump inhibitor for some time. Today she saw her primary care physician who restarted her on her PPI but noted that her hemoglobin was 11 today and 13 one month ago so advised her to come to the emergency department. The patient denies abdominal pain. She denies vomiting. She denies nausea. She feels generally well. Her hematochezia began suddenly and has been slowly improving. Nothing in particular seems to make it better or worse.   Past Medical History:  Diagnosis Date  . Headache    migraines - 1x/mo - better since topamax  . Hypercholesteremia   . Hypertension   . Multilevel degenerative disc disease   . Wears dentures    partial upper and lower    Patient Active Problem List   Diagnosis Date Noted  . Gastroesophageal reflux disease with esophagitis 06/14/2017  . Intestinal metaplasia of gastric mucosa 02/11/2017  . Adenomatous colon polyp   . Mixed hyperlipidemia 06/10/2015  . Smokes tobacco daily 03/23/2015  . Essential (primary) hypertension 03/23/2015  . Intraductal papilloma of breast, left 03/23/2015  . Hot flash, menopausal 03/23/2015  . Migraine without aura and responsive to treatment 03/23/2015  . 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 ANTERIOR CERVICAL DISCECTOMY  2010  . BREAST BIOPSY Left 08/2014   papilloma  . COLONOSCOPY WITH PROPOFOL N/A 08/02/2016   Procedure: COLONOSCOPY WITH PROPOFOL;  Surgeon: [name], MD;  Location: [place];  Service: Endoscopy;  Laterality: N/A;  . ENDOSCOPIC HEMILAMINOTOMY W/ DISCECTOMY LUMBAR  2009, 2011  . ESOPHAGEAL DILATION  08/02/2016   Procedure: ESOPHAGEAL DILATION;  Surgeon: [name], MD;  Location: [place];  Service: Endoscopy;;  . ESOPHAGOGASTRODUODENOSCOPY (EGD) WITH PROPOFOL N/A 08/02/2016   Repeat EGD 08/2019. Performed by Dr. [name]    Prior to Admission medications   Medication Sig Start Date End Date Taking? Authorizing Provider  atorvastatin (LIPITOR) 10 MG tablet TAKE 1 TABLET (10 MG TOTAL) BY MOUTH DAILY. 03/10/17  Yes Glean Hess, MD  buPROPion [facility] SR) 150 MG 12 hr tablet Take 1 tablet (150 mg total) by mouth 2 (two) times daily. 06/14/17  Yes Glean Hess, MD  dexlansoprazole (DEXILANT) 60 MG capsule Take 1 capsule (60 mg total) by mouth daily. 06/14/17  Yes Glean Hess, MD  irbesartan (AVAPRO) 150 MG tablet TAKE 1 TABLET (150 MG TOTAL) BY MOUTH DAILY. 06/10/17  Yes Glean Hess, MD  spironolactone (ALDACTONE) 50 MG tablet TAKE 1 TABLET (50 MG TOTAL) BY MOUTH DAILY. Patient taking differently: Take 25 mg by mouth daily.  10/06/16  Yes Glean Hess, MD  topiramate (TOPAMAX) 25 MG tablet TAKE 2 TABLETS (50 MG TOTAL) BY MOUTH AT BEDTIME. 06/10/17  Yes Glean Hess, MD  albuterol (PROVENTIL HFA;VENTOLIN HFA)  108 (90 Base) MCG/ACT inhaler Inhale 2 puffs into the lungs every 6 (six) hours as needed for wheezing or shortness of breath. 10/12/16   Glean Hess, MD  Butalbital-APAP-Caffeine 50-300-40 MG CAPS Take 1 capsule by mouth 2 (two) times daily as needed. migraine 03/23/15   Glean Hess, MD  famotidine (PEPCID) 20 MG tablet Take 1 tablet (20 mg total) by mouth 2 (two) times daily.  07/24/17 07/24/18  Darel Hong, MD    Allergies Levofloxacin; Ace inhibitors; and Prednisone  Family History  Problem Relation Age of Onset  . Breast cancer Mother   . Cancer Mother   . Diabetes Father   . Osteoarthritis Father     Social History Social History  Substance Use Topics  . Smoking status: Current Every Day Smoker    Packs/day: 0.50    Years: 20.00    Types: Cigarettes  . Smokeless tobacco: Never Used  . Alcohol use 1.2 oz/week    2 Standard drinks or equivalent per week     Comment: occasionally    Review of Systems Constitutional: No fever/chills Eyes: No visual changes. ENT: No sore throat. Cardiovascular: Denies chest pain. Respiratory: Denies shortness of breath. Gastrointestinal: No abdominal pain.  No nausea, no vomiting.  No diarrhea.  No constipation. Genitourinary: Negative for dysuria. Musculoskeletal: Negative for back pain. Skin: Negative for rash. Neurological: Negative for headaches, focal weakness or numbness.   ____________________________________________   PHYSICAL EXAM:  VITAL SIGNS: ED Triage Vitals  Enc Vitals Group     BP 07/24/17 1310 137/62     Pulse Rate 07/24/17 1310 99     Resp 07/24/17 1310 16     Temp 07/24/17 1310 98.4 F (36.9 C)     Temp Source 07/24/17 1310 Oral     SpO2 07/24/17 1310 100 %     Weight 07/24/17 1310 179 lb (81.2 kg)     Height 07/24/17 1310 5\' 5"  (1.651 m)     Head Circumference --      Peak Flow --      Pain Score 07/24/17 1410 0     Pain Loc --      Pain Edu? --      Excl. in [place]? --     Constitutional: alert and oriented 4 pleasant cooperative speaks in full clear sentences no diaphoresis Eyes: PERRL EOMI. Head: Atraumatic. Nose: No congestion/rhinnorhea. Mouth/Throat: No trismus Neck: No stridor.   Cardiovascular: Normal rate, regular rhythm. Grossly normal heart sounds.  Good peripheral circulation. Respiratory: Normal respiratory effort.  No retractions. Lungs CTAB and moving  good air Gastrointestinal: soft nondistended nontender no rebound or guarding no peritonitis no McBurney's tenderness negative Rovsing's no costovertebral tenderness Rectal exam chaperoned by female nurse: Normal external exam guaiac positive control positive dark red stool Musculoskeletal: No lower extremity edema   Neurologic:  Normal speech and language. No gross focal neurologic deficits are appreciated. Skin:  Skin is warm, dry and intact. No rash noted. Psychiatric: Mood and affect are normal. Speech and behavior are normal.    ____________________________________________   DIFFERENTIAL includes but not limited to  upper GI bleed, lower GI bleed, esophageal varices, diverticulitis ____________________________________________   LABS (all labs ordered are listed, but only abnormal results are displayed)  Labs Reviewed  COMPREHENSIVE METABOLIC PANEL - Abnormal; Notable for the following:       Result Value   Creatinine, Ser 1.25 (*)    Total Bilirubin 0.2 (*)    GFR calc non Af Wyvonnia Lora  46 (*)    GFR calc Af Amer 53 (*)    All other components within normal limits  CBC - Abnormal; Notable for the following:    Hemoglobin 10.9 (*)    HCT 32.3 (*)    RDW 15.7 (*)    All other components within normal limits  POC OCCULT BLOOD, ED  TYPE AND SCREEN    blood work reviewed and interpreted by me does show a hemoglobin of 11 with slightly high RDW consistent with iron deficiency __________________________________________  EKG   ____________________________________________  RADIOLOGY   ____________________________________________   PROCEDURES  Procedure(s) performed: no  Procedures  Critical Care performed: no  Observation: no ____________________________________________   INITIAL IMPRESSION / ASSESSMENT AND PLAN / ED COURSE  Pertinent labs & imaging results that were available during my care of the patient were reviewed by me and considered in my medical  decision making (see chart for details).       ----------------------------------------- 3:33 PM on 07/24/2017 -----------------------------------------  The patient is very well-appearing and hemodynamically stable. Her abdominal exam is benign. She does have dark hematochezia on digital rectal examination however her hemoglobin is 11. She has been noncompliant with her proton pump inhibitor for some time and only recently restarted today.  at this point the patient does not require a blood transfusion or further resuscitation. She does not require advanced imaging. I we will begin her as well on famotidine while the proton pump inhibitor is beginning AcipHex. She is referred back to primary care as well as gastroenterology. She is discharged home in improved condition verbalizes understanding and agreement with plan. ____________________________________________   FINAL CLINICAL IMPRESSION(S) / ED DIAGNOSES  Final diagnoses:  Hematochezia      NEW MEDICATIONS STARTED DURING THIS VISIT:  New Prescriptions   FAMOTIDINE (PEPCID) 20 MG TABLET    Take 1 tablet (20 mg total) by mouth 2 (two) times daily.     Note:  This document was prepared using Dragon voice recognition software and may include unintentional dictation errors.     Darel Hong, MD 07/25/17 1436

## 2017-07-29 ENCOUNTER — Encounter: Payer: Self-pay | Admitting: Emergency Medicine

## 2017-07-29 ENCOUNTER — Other Ambulatory Visit (INDEPENDENT_AMBULATORY_CARE_PROVIDER_SITE_OTHER): Payer: 59 | Admitting: Family Medicine

## 2017-07-29 ENCOUNTER — Other Ambulatory Visit: Payer: Self-pay

## 2017-07-29 ENCOUNTER — Emergency Department: Payer: 59

## 2017-07-29 ENCOUNTER — Emergency Department
Admission: EM | Admit: 2017-07-29 | Discharge: 2017-07-29 | Disposition: A | Discharge status: Home / Self Care | Payer: 59 | Attending: Emergency Medicine | Admitting: Emergency Medicine

## 2017-07-29 VITALS — BP 154/82 | HR 86 | Temp 98.1°F

## 2017-07-29 DIAGNOSIS — I10 Essential (primary) hypertension: Secondary | ICD-10-CM | POA: Insufficient documentation

## 2017-07-29 DIAGNOSIS — F17210 Nicotine dependence, cigarettes, uncomplicated: Secondary | ICD-10-CM | POA: Insufficient documentation

## 2017-07-29 DIAGNOSIS — K921 Melena: Secondary | ICD-10-CM | POA: Diagnosis not present

## 2017-07-29 DIAGNOSIS — Z79899 Other long term (current) drug therapy: Secondary | ICD-10-CM | POA: Diagnosis not present

## 2017-07-29 DIAGNOSIS — K625 Hemorrhage of anus and rectum: Secondary | ICD-10-CM

## 2017-07-29 DIAGNOSIS — R1032 Left lower quadrant pain: Secondary | ICD-10-CM | POA: Diagnosis not present

## 2017-07-29 DIAGNOSIS — K5792 Diverticulitis of intestine, part unspecified, without perforation or abscess without bleeding: Secondary | ICD-10-CM

## 2017-07-29 DIAGNOSIS — K922 Gastrointestinal hemorrhage, unspecified: Secondary | ICD-10-CM

## 2017-07-29 LAB — CBC
HCT: 31.7 % — ABNORMAL LOW (ref 35.0–47.0)
Hemoglobin: 10.5 g/dL — ABNORMAL LOW (ref 12.0–16.0)
MCH: 27.1 pg (ref 26.0–34.0)
MCHC: 33.2 g/dL (ref 32.0–36.0)
MCV: 81.6 fL (ref 80.0–100.0)
Platelets: 421 10*3/uL (ref 150–440)
RBC: 3.88 MIL/uL (ref 3.80–5.20)
RDW: 15.5 % — ABNORMAL HIGH (ref 11.5–14.5)
WBC: 9.9 10*3/uL (ref 3.6–11.0)

## 2017-07-29 LAB — COMPREHENSIVE METABOLIC PANEL WITH GFR
ALT: 16 U/L (ref 14–54)
AST: 20 U/L (ref 15–41)
Albumin: 3.7 g/dL (ref 3.5–5.0)
Alkaline Phosphatase: 85 U/L (ref 38–126)
Anion gap: 6 (ref 5–15)
BUN: 13 mg/dL (ref 6–20)
CO2: 26 mmol/L (ref 22–32)
Calcium: 9.4 mg/dL (ref 8.9–10.3)
Chloride: 108 mmol/L (ref 101–111)
Creatinine, Ser: 1.23 mg/dL — ABNORMAL HIGH (ref 0.44–1.00)
GFR calc Af Amer: 54 mL/min — ABNORMAL LOW
GFR calc non Af Amer: 47 mL/min — ABNORMAL LOW
Glucose, Bld: 90 mg/dL (ref 65–99)
Potassium: 4.7 mmol/L (ref 3.5–5.1)
Sodium: 140 mmol/L (ref 135–145)
Total Bilirubin: 0.3 mg/dL (ref 0.3–1.2)
Total Protein: 7.4 g/dL (ref 6.5–8.1)

## 2017-07-29 LAB — HEMOCCULT GUIAC POC 1CARD (OFFICE): Fecal Occult Blood, POC: POSITIVE — AB

## 2017-07-29 MED ORDER — METRONIDAZOLE 500 MG PO TABS
500.0000 mg | ORAL_TABLET | Freq: Once | ORAL | Status: AC
  Administered 2017-07-29: 500 mg via ORAL
  Filled 2017-07-29: qty 1

## 2017-07-29 MED ORDER — MORPHINE SULFATE (PF) 4 MG/ML IV SOLN
4.0000 mg | Freq: Once | INTRAVENOUS | Status: AC
  Administered 2017-07-29: 4 mg via INTRAVENOUS
  Filled 2017-07-29: qty 1

## 2017-07-29 MED ORDER — AMOXICILLIN-POT CLAVULANATE 875-125 MG PO TABS
1.0000 | ORAL_TABLET | Freq: Once | ORAL | Status: AC
  Administered 2017-07-29: 1 via ORAL
  Filled 2017-07-29: qty 1

## 2017-07-29 MED ORDER — AMOXICILLIN-POT CLAVULANATE 875-125 MG PO TABS: 1.0000 | ORAL_TABLET | Freq: Two times a day (BID) | ORAL | 0 refills | Status: AC

## 2017-07-29 MED ORDER — HYDROCODONE-ACETAMINOPHEN 5-325 MG PO TABS: 1.0000 | ORAL_TABLET | Freq: Four times a day (QID) | ORAL | 0 refills | Status: DC | PRN

## 2017-07-29 MED ORDER — METRONIDAZOLE 500 MG PO TABS: 500.0000 mg | ORAL_TABLET | Freq: Three times a day (TID) | ORAL | 0 refills | Status: AC

## 2017-07-29 MED ORDER — IOPAMIDOL (ISOVUE-300) INJECTION 61%
100.0000 mL | Freq: Once | INTRAVENOUS | Status: AC | PRN
  Administered 2017-07-29: 100 mL via INTRAVENOUS

## 2017-07-29 MED ORDER — ONDANSETRON HCL 4 MG/2ML IJ SOLN
4.0000 mg | Freq: Once | INTRAMUSCULAR | Status: AC
  Administered 2017-07-29: 4 mg via INTRAVENOUS
  Filled 2017-07-29: qty 2

## 2017-07-29 NOTE — ED Provider Notes (Signed)
[facility] Emergency Department Provider Note  ____________________________________________   First MD Initiated Contact with Patient 07/29/17 1309     (approximate)  I have reviewed the triage vital signs and the nursing notes.   HISTORY  Chief Complaint Rectal Bleeding   HPI Jacqueline Harmon is a 61 y.o. female who is sent to the emergency department by her primary care physician with moderate severity cramping lower abdominal pain for the last 24 hours. She is also guaiac positive. Actually cared for the patient 5 days ago when she was in the emergency department for hematochezia. She has been noncompliant with her PPI although she reports for the past 5 days taking it. She does have a past medical history of gastritis. Previously she had hematochezia and today her stools brown although guaiac positive. She's had no diarrhea. No constipation. No history of abdominal surgeries. Her pain is aching cramping throbbing moderate severity left lower quadrant nonradiating nothing seems to make it better or worse.   Past Medical History:  Diagnosis Date  . Headache    migraines - 1x/mo - better since topamax  . Hypercholesteremia   . Hypertension   . Multilevel degenerative disc disease   . Wears dentures    partial upper and lower    Patient Active Problem List   Diagnosis Date Noted  . Gastroesophageal reflux disease with esophagitis 06/14/2017  . Intestinal metaplasia of gastric mucosa 02/11/2017  . Adenomatous colon polyp   . Mixed hyperlipidemia 06/10/2015  . Smokes tobacco daily 03/23/2015  . Essential (primary) hypertension 03/23/2015  . Intraductal papilloma of breast, left 03/23/2015  . Hot flash, menopausal 03/23/2015  . Migraine without aura and responsive to treatment 03/23/2015  . 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 ANTERIOR CERVICAL DISCECTOMY  2010  . BREAST BIOPSY Left 08/2014   papilloma  . COLONOSCOPY WITH PROPOFOL N/A 08/02/2016   Procedure: COLONOSCOPY WITH PROPOFOL;  Surgeon: [name], MD;  Location: [place];  Service: Endoscopy;  Laterality: N/A;  . ENDOSCOPIC HEMILAMINOTOMY W/ DISCECTOMY LUMBAR  2009, 2011  . ESOPHAGEAL DILATION  08/02/2016   Procedure: ESOPHAGEAL DILATION;  Surgeon: [name], MD;  Location: [place];  Service: Endoscopy;;  . ESOPHAGOGASTRODUODENOSCOPY (EGD) WITH PROPOFOL N/A 08/02/2016   Repeat EGD 08/2019. Performed by Dr. [name]    Prior to Admission medications   Medication Sig Start Date End Date Taking? Authorizing Provider  albuterol (PROVENTIL HFA;VENTOLIN HFA) 108 (90 Base) MCG/ACT inhaler Inhale 2 puffs into the lungs every 6 (six) hours as needed for wheezing or shortness of breath. 10/12/16  Yes Glean Hess, MD  atorvastatin (LIPITOR) 10 MG tablet TAKE 1 TABLET (10 MG TOTAL) BY MOUTH DAILY. 03/10/17  Yes Glean Hess, MD  buPROPion [facility] SR) 150 MG 12 hr tablet Take 1 tablet (150 mg total) by mouth 2 (two) times daily. 06/14/17  Yes Glean Hess, MD  Butalbital-APAP-Caffeine 50-300-40 MG CAPS Take 1 capsule by mouth 2 (two) times daily as needed. migraine 03/23/15  Yes Glean Hess, MD  dexlansoprazole (DEXILANT) 60 MG capsule Take 1 capsule (60 mg total) by mouth daily. 06/14/17  Yes Glean Hess, MD  famotidine (PEPCID) 20 MG tablet Take 1 tablet (20 mg total) by mouth 2 (two) times daily. 07/24/17 07/24/18 Yes Areen Trautner, Milta Deiters, MD  irbesartan (AVAPRO) 150 MG tablet TAKE 1 TABLET (150 MG TOTAL) BY MOUTH DAILY. 06/10/17  Yes Glean Hess, MD  spironolactone (ALDACTONE) 50 MG tablet TAKE 1 TABLET (50 MG TOTAL) BY MOUTH DAILY. Patient taking differently: Take 25 mg by mouth daily.  10/06/16  Yes Glean Hess, MD  topiramate (TOPAMAX) 25 MG tablet TAKE 2 TABLETS (50 MG TOTAL) BY MOUTH AT BEDTIME. 06/10/17  Yes Glean Hess, MD  amoxicillin-clavulanate (AUGMENTIN)  875-125 MG tablet Take 1 tablet by mouth 2 (two) times daily. 07/29/17 08/12/17  Darel Hong, MD  HYDROcodone-acetaminophen (NORCO) 5-325 MG tablet Take 1 tablet by mouth every 6 (six) hours as needed for severe pain. 07/29/17   Darel Hong, MD  metroNIDAZOLE (FLAGYL) 500 MG tablet Take 1 tablet (500 mg total) by mouth 3 (three) times daily. 07/29/17 08/12/17  Darel Hong, MD    Allergies Levofloxacin; Ace inhibitors; and Prednisone  Family History  Problem Relation Age of Onset  . Breast cancer Mother   . Cancer Mother   . Diabetes Father   . Osteoarthritis Father     Social History Social History  Substance Use Topics  . Smoking status: Current Every Day Smoker    Packs/day: 0.50    Years: 20.00    Types: Cigarettes  . Smokeless tobacco: Never Used  . Alcohol use 1.2 oz/week    2 Standard drinks or equivalent per week     Comment: occasionally    Review of Systems Constitutional: No fever/chills Eyes: No visual changes. ENT: No sore throat. Cardiovascular: Denies chest pain. Respiratory: Denies shortness of breath. Gastrointestinal: positive for abdominal pain.  positive for nausea, no vomiting.  No diarrhea.  No constipation. Genitourinary: Negative for dysuria. Musculoskeletal: Negative for back pain. Skin: Negative for rash. Neurological: Negative for headaches, focal weakness or numbness.   ____________________________________________   PHYSICAL EXAM:  VITAL SIGNS: ED Triage Vitals  Enc Vitals Group     BP 07/29/17 1046 139/72     Pulse Rate 07/29/17 1046 78     Resp 07/29/17 1046 20     Temp 07/29/17 1046 98.4 F (36.9 C)     Temp Source 07/29/17 1046 Oral     SpO2 07/29/17 1046 96 %     Weight 07/29/17 1047 179 lb (81.2 kg)     Height 07/29/17 1047 5\' 5"  (1.651 m)     Head Circumference --      Peak Flow --      Pain Score 07/29/17 1046 2     Pain Loc --      Pain Edu? --      Excl. in [place]? --     Constitutional: alert and oriented  4 well appearing nontoxic no diaphoresis speaks in full clear sentences Eyes: PERRL EOMI. Head: Atraumatic. Nose: No congestion/rhinnorhea. Mouth/Throat: No trismus Neck: No stridor.   Cardiovascular: Normal rate, regular rhythm. Grossly normal heart sounds.  Good peripheral circulation. Respiratory: Normal respiratory effort.  No retractions. Lungs CTAB and moving good air Gastrointestinal: soft nondistended mild lower abdominal tenderness with no rebound or guarding no peritonitis Musculoskeletal: No lower extremity edema   Neurologic:  Normal speech and language. No gross focal neurologic deficits are appreciated. Skin:  Skin is warm, dry and intact. No rash noted. Psychiatric: Mood and affect are normal. Speech and behavior are normal.    ____________________________________________   DIFFERENTIAL includes but not limited to  gastritis, appendicitis, diverticulitis, small bowel obstruction ____________________________________________   LABS (all labs ordered are listed, but only abnormal results are displayed)  Labs Reviewed  COMPREHENSIVE METABOLIC PANEL - Abnormal; Notable for the following:  Result Value   Creatinine, Ser 1.23 (*)    GFR calc non Af Amer 47 (*)    GFR calc Af Amer 54 (*)    All other components within normal limits  CBC - Abnormal; Notable for the following:    Hemoglobin 10.5 (*)    HCT 31.7 (*)    RDW 15.5 (*)    All other components within normal limits    blood work reviewed and interpreted by me shows a hemoglobin of 10.5 down and only 0.5 points in the past 5 days __________________________________________  EKG   ____________________________________________  RADIOLOGY  CT scan reviewed by me concerning for possible cecal diverticulitis with no perforation ____________________________________________   PROCEDURES  Procedure(s) performed: no  Procedures  Critical Care performed: no  Observation:  no ____________________________________________   INITIAL IMPRESSION / ASSESSMENT AND PLAN / ED COURSE  Pertinent labs & imaging results that were available during my care of the patient were reviewed by me and considered in my medical decision making (see chart for details).  The patient seems to have 2 issues going on. Regarding her guaiac positive stool this is actually improving from frank hematochezia several days ago and her hemoglobin has only dropped from 11-10.5. I think she is stable to continue taking her PPI and to follow up with GI as already planned. She does have new abdominal pain which is never had before so she will require CT scan abdomen pelvis.  The patient's CT scan is nonspecific but does show some cecal edema which could be consistent with diverticulitis. I will treat her symptomatically with a 2 week course and refer her to general surgery as an outpatient. Strict return precautions are given and the patient verbalizes understanding and agreement with plan.      ____________________________________________   FINAL CLINICAL IMPRESSION(S) / ED DIAGNOSES  Final diagnoses:  Diverticulitis  Gastrointestinal hemorrhage, unspecified gastrointestinal hemorrhage type      NEW MEDICATIONS STARTED DURING THIS VISIT:  Discharge Medication List as of 07/29/2017  2:57 PM    START taking these medications   Details  amoxicillin-clavulanate (AUGMENTIN) 875-125 MG tablet Take 1 tablet by mouth 2 (two) times daily., Starting Mon 07/29/2017, Until Mon 08/12/2017, Print    HYDROcodone-acetaminophen (NORCO) 5-325 MG tablet Take 1 tablet by mouth every 6 (six) hours as needed for severe pain., Starting Mon 07/29/2017, Print    metroNIDAZOLE (FLAGYL) 500 MG tablet Take 1 tablet (500 mg total) by mouth 3 (three) times daily., Starting Mon 07/29/2017, Until Mon 08/12/2017, Print         Note:  This document was prepared using Dragon voice recognition software and may include  unintentional dictation errors.     Darel Hong, MD 07/29/17 2139

## 2017-07-29 NOTE — Progress Notes (Signed)
Name: Jacqueline Harmon   MRN: [id number]    DOB: 09-06-1956   Date:07/29/2017       Progress Note  Subjective  Chief Complaint  No chief complaint on file.   Abdominal Pain  This is a new problem. The current episode started today. The onset quality is sudden. The problem occurs constantly. The problem has been gradually worsening. The pain is located in the LLQ. The pain is at a severity of 5/10. The quality of the pain is cramping. The abdominal pain radiates to the back. Associated symptoms include hematochezia. Pertinent negatives include no constipation, diarrhea, dysuria, fever, frequency, headaches, hematuria, melena, myalgias, nausea, vomiting or weight loss. The pain is aggravated by certain positions and movement. The pain is relieved by nothing. The treatment provided mild relief. There is no history of abdominal surgery, colon cancer or ulcerative colitis. hx diverticulosis  Rectal Bleeding   The current episode started 5 to 7 days ago. The onset was sudden. The problem occurs occasionally. The problem has been gradually improving. The pain is moderate. The stool is described as bloody (jelly-like). Associated symptoms include abdominal pain. Pertinent negatives include no fever, no diarrhea, no hematemesis, no nausea, no rectal pain, no vomiting, no hematuria, no chest pain, no headaches, no coughing and no rash. Her past medical history does not include abdominal surgery. Past medical history comments: hx diverticulosis.    No problem-specific Assessment & Plan notes found for this encounter.   Past Medical History:  Diagnosis Date  . Headache    migraines - 1x/mo - better since topamax  . Hypercholesteremia   . Hypertension   . Multilevel degenerative disc disease   . Wears dentures    partial upper and lower    Past Surgical History:  Procedure Laterality Date  . ANTERIOR CERVICAL DISCECTOMY  2010  . BREAST BIOPSY Left 08/2014   papilloma  . COLONOSCOPY WITH PROPOFOL  N/A 08/02/2016   Procedure: COLONOSCOPY WITH PROPOFOL;  Surgeon: [name], MD;  Location: [facility];  Service: Endoscopy;  Laterality: N/A;  . ENDOSCOPIC HEMILAMINOTOMY W/ DISCECTOMY LUMBAR  2009, 2011  . ESOPHAGEAL DILATION  08/02/2016   Procedure: ESOPHAGEAL DILATION;  Surgeon: [name], MD;  Location: [facility];  Service: Endoscopy;;  . ESOPHAGOGASTRODUODENOSCOPY (EGD) WITH PROPOFOL N/A 08/02/2016   Repeat EGD 08/2019. Performed by Dr. [name]    Family History  Problem Relation Age of Onset  . Breast cancer Mother   . Cancer Mother   . Diabetes Father   . Osteoarthritis Father     Social History   Social History  . Marital status: Married    Spouse name: N/A  . Number of children: N/A  . Years of education: N/A   Occupational History  . Paralegal    Social History Main Topics  . Smoking status: Current Every Day Smoker    Packs/day: 0.50    Years: 20.00    Types: Cigarettes  . Smokeless tobacco: Never Used  . Alcohol use 1.2 oz/week    2 Standard drinks or equivalent per week     Comment: occasionally  . Drug use: No  . Sexual activity: Not on file   Other Topics Concern  . Not on file   Social History Narrative  . No narrative on file    Allergies  Allergen Reactions  . Levofloxacin Shortness Of Breath    myalgia, constipation  . Ace Inhibitors Cough    One particular medication causes problems - maybe  starts with letter L  . Prednisone Itching    Outpatient Medications Prior to Visit  Medication Sig Dispense Refill  . albuterol (PROVENTIL HFA;VENTOLIN HFA) 108 (90 Base) MCG/ACT inhaler Inhale 2 puffs into the lungs every 6 (six) hours as needed for wheezing or shortness of breath. 1 Inhaler 0  . atorvastatin (LIPITOR) 10 MG tablet TAKE 1 TABLET (10 MG TOTAL) BY MOUTH DAILY. 90 tablet 1  . buPROPion (WELLBUTRIN SR) 150 MG 12 hr tablet Take 1 tablet (150 mg total) by mouth 2 (two) times daily. 60 tablet 5  .  Butalbital-APAP-Caffeine 50-300-40 MG CAPS Take 1 capsule by mouth 2 (two) times daily as needed. migraine 60 capsule 5  . dexlansoprazole (DEXILANT) 60 MG capsule Take 1 capsule (60 mg total) by mouth daily. 90 capsule 3  . famotidine (PEPCID) 20 MG tablet Take 1 tablet (20 mg total) by mouth 2 (two) times daily. 60 tablet 0  . irbesartan (AVAPRO) 150 MG tablet TAKE 1 TABLET (150 MG TOTAL) BY MOUTH DAILY. 90 tablet 1  . spironolactone (ALDACTONE) 50 MG tablet TAKE 1 TABLET (50 MG TOTAL) BY MOUTH DAILY. (Patient taking differently: Take 25 mg by mouth daily. ) 90 tablet 2  . topiramate (TOPAMAX) 25 MG tablet TAKE 2 TABLETS (50 MG TOTAL) BY MOUTH AT BEDTIME. 180 tablet 1   No facility-administered medications prior to visit.     Review of Systems  Constitutional: Negative for chills, fever, malaise/fatigue and weight loss.  HENT: Negative for ear discharge, ear pain and sore throat.   Eyes: Negative for blurred vision.  Respiratory: Negative for cough, sputum production, shortness of breath and wheezing.   Cardiovascular: Negative for chest pain, palpitations and leg swelling.  Gastrointestinal: Positive for abdominal pain and hematochezia. Negative for blood in stool, constipation, diarrhea, heartburn, hematemesis, melena, nausea, rectal pain and vomiting.  Genitourinary: Negative for dysuria, frequency, hematuria and urgency.  Musculoskeletal: Negative for back pain, joint pain, myalgias and neck pain.  Skin: Negative for rash.  Neurological: Negative for dizziness, tingling, sensory change, focal weakness and headaches.  Endo/Heme/Allergies: Negative for environmental allergies and polydipsia. Does not bruise/bleed easily.  Psychiatric/Behavioral: Negative for depression and suicidal ideas. The patient is not nervous/anxious and does not have insomnia.      Objective  Vitals:   07/29/17 0849 07/29/17 0850 07/29/17 0851  BP: (!) 154/82 (!) 158/88 (!) 154/82  Pulse: 86 83 86     Physical Exam  Constitutional: She is well-developed, well-nourished, and in no distress. No distress.  HENT:  Head: Normocephalic and atraumatic.  Right Ear: External ear normal.  Left Ear: External ear normal.  Nose: Nose normal.  Mouth/Throat: Oropharynx is clear and moist.  Eyes: Pupils are equal, round, and reactive to light. Conjunctivae and EOM are normal. Right eye exhibits no discharge. Left eye exhibits no discharge.  Neck: Normal range of motion. Neck supple. No JVD present. No thyromegaly present.  Cardiovascular: Normal rate, regular rhythm, normal heart sounds and intact distal pulses.  Exam reveals no gallop and no friction rub.   No murmur heard. Pulmonary/Chest: Effort normal and breath sounds normal. She has no wheezes. She has no rales.  Abdominal: Soft. Bowel sounds are normal. She exhibits no mass. There is no hepatosplenomegaly. There is tenderness in the left lower quadrant. There is guarding. There is no rigidity, no rebound and no CVA tenderness.  Genitourinary: Rectal exam shows guaiac positive stool.  Musculoskeletal: Normal range of motion. She exhibits no edema.  Lymphadenopathy:  She has no cervical adenopathy.  Neurological: She is alert. She has normal reflexes.  Skin: Skin is warm and dry. She is not diaphoretic.  Psychiatric: Mood and affect normal.  Nursing note and vitals reviewed.     Assessment & Plan  Problem List Items Addressed This Visit    None      No orders of the defined types were placed in this encounter.     Dr. Macon [facility]  07/29/17

## 2017-07-29 NOTE — Addendum Note (Signed)
Addended by: Juline Patch on: 07/29/2017 09:16 AM   Modules accepted: Orders, Level of Service

## 2017-07-29 NOTE — ED Notes (Signed)
Pt verbalizes understanding of d/c teaching and RX. Pt in NAD at time of d/c. VS stable. Pt ambulatory with spouse to lobby

## 2017-07-29 NOTE — Patient Instructions (Signed)
Go to the ER with husband and take this note.

## 2017-07-29 NOTE — ED Triage Notes (Signed)
States blood noted with stool and lower abd pain x 1 week. Went to MD office today and noted positive hemacult in stool and sent here.

## 2017-07-29 NOTE — Discharge Instructions (Signed)
Please take both of your antibiotics as prescribed for a full 2 weeks and make an appointment to follow-up with the general surgeons for recheck of your stomach. It is also critically important that you make the appointment to follow-up with a gastroenterologist.  Return to the emergency department for any new or worsening symptoms such as fevers, chills, worsening pain, if you cannot eat or drink, or for any other concerns whatsoever.  It was a pleasure to take care of you today, and thank you for coming to our emergency department.  If you have any questions or concerns before leaving please ask the nurse to grab me and I'm more than happy to go through your aftercare instructions again.  If you were prescribed any opioid pain medication today such as Norco, Vicodin, Percocet, morphine, hydrocodone, or oxycodone please make sure you do not drive when you are taking this medication as it can alter your ability to drive safely.  If you have any concerns once you are home that you are not improving or are in fact getting worse before you can make it to your follow-up appointment, please do not hesitate to call 911 and come back for further evaluation.  Darel Hong, MD  Results for orders placed or performed during the hospital encounter of 07/29/17  Comprehensive metabolic panel  Result Value Ref Range   Sodium 140 135 - 145 mmol/L   Potassium 4.7 3.5 - 5.1 mmol/L   Chloride 108 101 - 111 mmol/L   CO2 26 22 - 32 mmol/L   Glucose, Bld 90 65 - 99 mg/dL   BUN 13 6 - 20 mg/dL   Creatinine, Ser 1.23 (H) 0.44 - 1.00 mg/dL   Calcium 9.4 8.9 - 10.3 mg/dL   Total Protein 7.4 6.5 - 8.1 g/dL   Albumin 3.7 3.5 - 5.0 g/dL   AST 20 15 - 41 U/L   ALT 16 14 - 54 U/L   Alkaline Phosphatase 85 38 - 126 U/L   Total Bilirubin 0.3 0.3 - 1.2 mg/dL   GFR calc non Af Amer 47 (L) >60 mL/min   GFR calc Af Amer 54 (L) >60 mL/min   Anion gap 6 5 - 15  CBC  Result Value Ref Range   WBC 9.9 3.6 - 11.0 K/uL   RBC 3.88 3.80 - 5.20 MIL/uL   Hemoglobin 10.5 (L) 12.0 - 16.0 g/dL   HCT 31.7 (L) 35.0 - 47.0 %   MCV 81.6 80.0 - 100.0 fL   MCH 27.1 26.0 - 34.0 pg   MCHC 33.2 32.0 - 36.0 g/dL   RDW 15.5 (H) 11.5 - 14.5 %   Platelets 421 150 - 440 K/uL   Ct Abdomen Pelvis W Contrast  Result Date: 07/29/2017 CLINICAL DATA:  Bloody stools and lower abdominal pain. EXAM: CT ABDOMEN AND PELVIS WITH CONTRAST TECHNIQUE: Multidetector CT imaging of the abdomen and pelvis was performed using the standard protocol following bolus administration of intravenous contrast. CONTRAST:  1108mL ISOVUE-300 IOPAMIDOL (ISOVUE-300) INJECTION 61% COMPARISON:  04/08/2013. FINDINGS: Lower chest: Lung bases show scattered atelectasis and/or scarring. Heart size within normal limits. No pericardial or pleural effusion. Distal esophagus is grossly unremarkable. Hepatobiliary: Liver and gallbladder are unremarkable. No biliary ductal dilatation. Pancreas: Negative. Spleen: Negative. Adrenals/Urinary Tract: Adrenal gland and kidneys are unremarkable. Ureters are decompressed. Bladder is grossly unremarkable. Stomach/Bowel: Tiny hiatal hernia. Stomach, small bowel and appendix are unremarkable. Small amount of fluid associated with the cecum (best seen on the coronal images), nonspecific.  Colon is otherwise unremarkable. Vascular/Lymphatic: Gastrohepatic ligament and ileocolic mesenteric lymph nodes are subcentimeter in size. No pathologically enlarged lymph nodes. Atherosclerotic calcification of the arterial vasculature without abdominal aortic aneurysm. Reproductive: Uterus is visualized.  No adnexal mass. Other: No free fluid.  Mesenteries and peritoneum are unremarkable. Musculoskeletal: Degenerative changes in the spine. No worrisome lytic or sclerotic lesions. IMPRESSION: 1. Small amount of fluid associated with the cecum is nonspecific. An infectious or inflammatory etiology could have this appearance. Correlation with colonoscopy may be  helpful, as clinically indicated. 2.  Aortic atherosclerosis (ICD10-170.0). Electronically Signed   By: Lorin Picket M.D.   On: 07/29/2017 14:40

## 2017-08-13 ENCOUNTER — Ambulatory Visit
Admission: RE | Admit: 2017-08-13 | Discharge: 2017-08-13 | Disposition: A | Discharge status: Home / Self Care | Payer: 59 | Source: Ambulatory Visit | Attending: Internal Medicine | Admitting: Internal Medicine

## 2017-08-13 DIAGNOSIS — Z1239 Encounter for other screening for malignant neoplasm of breast: Secondary | ICD-10-CM

## 2017-08-13 DIAGNOSIS — Z86018 Personal history of other benign neoplasm: Secondary | ICD-10-CM | POA: Diagnosis not present

## 2017-08-13 DIAGNOSIS — Z1231 Encounter for screening mammogram for malignant neoplasm of breast: Secondary | ICD-10-CM | POA: Diagnosis not present

## 2017-09-10 ENCOUNTER — Encounter (INDEPENDENT_AMBULATORY_CARE_PROVIDER_SITE_OTHER): Payer: Self-pay

## 2017-09-10 ENCOUNTER — Encounter: Payer: Self-pay | Admitting: Gastroenterology

## 2017-09-10 ENCOUNTER — Other Ambulatory Visit: Payer: Self-pay | Admitting: Internal Medicine

## 2017-09-10 ENCOUNTER — Other Ambulatory Visit: Payer: Self-pay

## 2017-09-10 ENCOUNTER — Ambulatory Visit: Payer: 59 | Admitting: Gastroenterology

## 2017-09-10 VITALS — BP 149/79 | HR 92 | Temp 98.4°F | Ht 65.0 in | Wt 184.2 lb

## 2017-09-10 DIAGNOSIS — R1032 Left lower quadrant pain: Secondary | ICD-10-CM

## 2017-09-10 DIAGNOSIS — I10 Essential (primary) hypertension: Secondary | ICD-10-CM

## 2017-09-10 DIAGNOSIS — D649 Anemia, unspecified: Secondary | ICD-10-CM

## 2017-09-10 DIAGNOSIS — K921 Melena: Secondary | ICD-10-CM | POA: Diagnosis not present

## 2017-09-10 DIAGNOSIS — R112 Nausea with vomiting, unspecified: Secondary | ICD-10-CM | POA: Diagnosis not present

## 2017-09-10 NOTE — Progress Notes (Signed)
Primary Care Physician: Glean Hess, MD  Primary Gastroenterologist:  Dr. [name]  Chief Complaint  Patient presents with  . Follow up ER  . Rectal Bleeding    HPI: Jacqueline Harmon is a 61 y.o. female here who reports that she has had significant abdominal pain in the left abdomen.  The patient had a CT scan that showed her to have possible inflammation of the cecum.  The patient also has had chronic nausea. She states that she has been out of work because of her nausea and abdominal pain. The patient had a colonoscopy and EGD with a upper endoscopy showing her to have intestinal metaplasia and her colonoscopy showing her to have diverticulosis.  The patient reports that her rectal bleeding was dark but not black stools.  She states that it was more of a maroon. The patient has also had her hemoglobin trending down from 13 down to 10.5 recently.  Current Outpatient Medications  Medication Sig Dispense Refill  . albuterol (PROVENTIL HFA;VENTOLIN HFA) 108 (90 Base) MCG/ACT inhaler Inhale 2 puffs into the lungs every 6 (six) hours as needed for wheezing or shortness of breath. 1 Inhaler 0  . atorvastatin (LIPITOR) 10 MG tablet TAKE 1 TABLET BY MOUTH EVERY DAY 90 tablet 2  . buPROPion (WELLBUTRIN SR) 150 MG 12 hr tablet Take 1 tablet (150 mg total) by mouth 2 (two) times daily. 60 tablet 5  . Butalbital-APAP-Caffeine 50-300-40 MG CAPS Take 1 capsule by mouth 2 (two) times daily as needed. migraine 60 capsule 5  . dexlansoprazole (DEXILANT) 60 MG capsule Take 1 capsule (60 mg total) by mouth daily. 90 capsule 3  . famotidine (PEPCID) 20 MG tablet Take 1 tablet (20 mg total) by mouth 2 (two) times daily. 60 tablet 0  . HYDROcodone-acetaminophen (NORCO) 5-325 MG tablet Take 1 tablet by mouth every 6 (six) hours as needed for severe pain. 15 tablet 0  . irbesartan (AVAPRO) 150 MG tablet TAKE 1 TABLET (150 MG TOTAL) BY MOUTH DAILY. 90 tablet 1  . metroNIDAZOLE (FLAGYL) 500 MG tablet  Take 500 mg by mouth 3 (three) times daily.    [place] spironolactone (ALDACTONE) 50 MG tablet TAKE 1 TABLET (50 MG TOTAL) BY MOUTH DAILY. 90 tablet 2  . topiramate (TOPAMAX) 25 MG tablet TAKE 2 TABLETS (50 MG TOTAL) BY MOUTH AT BEDTIME. 180 tablet 1   No current facility-administered medications for this visit.     Allergies as of 09/10/2017 - Review Complete 09/10/2017  Allergen Reaction Noted  . Levofloxacin Shortness Of Breath 03/23/2015  . Ace inhibitors Cough 03/23/2015  . Prednisone Itching 04/04/2016    ROS:  General: Negative for anorexia, weight loss, fever, chills, fatigue, weakness. ENT: Negative for hoarseness, difficulty swallowing , nasal congestion. CV: Negative for chest pain, angina, palpitations, dyspnea on exertion, peripheral edema.  Respiratory: Negative for dyspnea at rest, dyspnea on exertion, cough, sputum, wheezing.  GI: See history of present illness. GU:  Negative for dysuria, hematuria, urinary incontinence, urinary frequency, nocturnal urination.  Endo: Negative for unusual weight change.    Physical Examination:   BP (!) 149/79 (BP Location: Left Arm, Patient Position: Sitting, Cuff Size: Normal)   Pulse 92   Temp 98.4 F (36.9 C) (Oral)   Ht 5\' 5"  (1.651 m)   Wt 184 lb 3.2 oz (83.6 kg)   BMI 30.65 kg/m   General: Well-nourished, well-developed in no acute distress.  Eyes: No icterus. Conjunctivae pink. Mouth: Oropharyngeal mucosa moist and  pink , no lesions erythema or exudate. Lungs: Clear to auscultation bilaterally. Non-labored. Heart: Regular rate and rhythm, no murmurs rubs or gallops.  Abdomen: Bowel sounds are normal, Positive left lower quadrant tenderness, nondistended, no hepatosplenomegaly or masses, no abdominal bruits or hernia , no rebound or guarding.   Extremities: No lower extremity edema. No clubbing or deformities. Neuro: Alert and oriented x 3.  Grossly intact. Skin: Warm and dry, no jaundice.   Psych: Alert and cooperative,  normal mood and affect.  Labs:    Imaging Studies: Mm Diag Breast Tomo Bilateral  Result Date: 08/13/2017 CLINICAL DATA:  The patient had a biopsy of left breast which showed papilloma in 2015. The patient elected not to have surgical removal. EXAM: 2D DIGITAL DIAGNOSTIC BILATERAL MAMMOGRAM WITH CAD AND ADJUNCT TOMO COMPARISON:  Previous exam(s). ACR Breast Density Category c: The breast tissue is heterogeneously dense, which may obscure small masses. FINDINGS: CC and MLO views of bilateral breasts are submitted. No suspicious abnormalities identified bilaterally. Biopsy clip is identified in the retroareolar left breast unchanged. Mammographic images were processed with CAD. IMPRESSION: Benign findings. RECOMMENDATION: Routine screening mammogram in 1 year. I have discussed the findings and recommendations with the patient. Results were also provided in writing at the conclusion of the visit. If applicable, a reminder letter will be sent to the patient regarding the next appointment. BI-RADS CATEGORY  2: Benign. Electronically Signed   By: Abelardo Diesel M.D.   On: 08/13/2017 11:50    Assessment and Plan:   Jacqueline Harmon is a 61 y.o. y/o female Who has abdominal pain with nausea and rectal bleeding which she states is dark in color.  The patient also states she has left sided abdominal pain with a CT scan showing fluid around the cecum suggestive of possible inflammation but the patient denies any pain on that side. The patient will be set up for an EGD and colonoscopy.  The patient will have these procedures to look for possible peptic ulcer disease versus colitis as the cause of her symptoms.  The patient will also have her blood sends off since her hemoglobin has been trending down recently and to make sure she does not have an elevated white cell count indicative of infection.  The patient has been explained the plan and agrees with it.    [name], MD. Marval Regal   Note: This dictation was  prepared with Dragon dictation along with smaller phrase technology. Any transcriptional errors that result from this process are unintentional.

## 2017-09-11 ENCOUNTER — Other Ambulatory Visit: Payer: Self-pay

## 2017-09-11 ENCOUNTER — Ambulatory Visit
Admission: RE | Admit: 2017-09-11 | Discharge: 2017-09-11 | Disposition: A | Discharge status: Home / Self Care | Payer: 59 | Source: Ambulatory Visit | Attending: Gastroenterology | Admitting: Gastroenterology

## 2017-09-11 ENCOUNTER — Telehealth: Payer: Self-pay

## 2017-09-11 DIAGNOSIS — M47897 Other spondylosis, lumbosacral region: Secondary | ICD-10-CM | POA: Insufficient documentation

## 2017-09-11 DIAGNOSIS — R1084 Generalized abdominal pain: Secondary | ICD-10-CM | POA: Diagnosis present

## 2017-09-11 DIAGNOSIS — I700 Atherosclerosis of aorta: Secondary | ICD-10-CM | POA: Diagnosis not present

## 2017-09-11 DIAGNOSIS — K5730 Diverticulosis of large intestine without perforation or abscess without bleeding: Secondary | ICD-10-CM | POA: Insufficient documentation

## 2017-09-11 DIAGNOSIS — M5137 Other intervertebral disc degeneration, lumbosacral region: Secondary | ICD-10-CM | POA: Diagnosis not present

## 2017-09-11 LAB — CBC WITH DIFFERENTIAL/PLATELET
BASOS ABS: 0.1 10*3/uL (ref 0.0–0.2)
Basos: 1 %
EOS (ABSOLUTE): 0.4 10*3/uL (ref 0.0–0.4)
Eos: 3 %
HEMOGLOBIN: 9.8 g/dL — AB (ref 11.1–15.9)
Hematocrit: 31.2 % — ABNORMAL LOW (ref 34.0–46.6)
IMMATURE GRANS (ABS): 0 10*3/uL (ref 0.0–0.1)
IMMATURE GRANULOCYTES: 0 %
LYMPHS: 32 %
Lymphocytes Absolute: 4.5 10*3/uL — ABNORMAL HIGH (ref 0.7–3.1)
MCH: 24.1 pg — ABNORMAL LOW (ref 26.6–33.0)
MCHC: 31.4 g/dL — ABNORMAL LOW (ref 31.5–35.7)
MCV: 77 fL — ABNORMAL LOW (ref 79–97)
MONOCYTES: 11 %
Monocytes Absolute: 1.5 10*3/uL — ABNORMAL HIGH (ref 0.1–0.9)
NEUTROS ABS: 7.3 10*3/uL — AB (ref 1.4–7.0)
NEUTROS PCT: 53 %
PLATELETS: 547 10*3/uL — AB (ref 150–379)
RBC: 4.06 x10E6/uL (ref 3.77–5.28)
RDW: 15.4 % (ref 12.3–15.4)
WBC: 13.9 10*3/uL — AB (ref 3.4–10.8)

## 2017-09-11 LAB — POCT I-STAT CREATININE: Creatinine, Ser: 1.3 mg/dL — ABNORMAL HIGH (ref 0.44–1.00)

## 2017-09-11 MED ORDER — IOPAMIDOL (ISOVUE-300) INJECTION 61%
100.0000 mL | Freq: Once | INTRAVENOUS | Status: AC | PRN
  Administered 2017-09-11: 100 mL via INTRAVENOUS

## 2017-09-11 NOTE — Telephone Encounter (Signed)
-----   Message from [name], MD sent at 09/11/2017  8:39 AM EST ----- Let the patient know her red cell count was down a bit more with her white cell count now elevated. She should have a repeat CT scan of the abdomen and pelvis to make sure she does not have diverticulitis or an infection in her abdomen.

## 2017-09-11 NOTE — Telephone Encounter (Signed)
Pt notified of lab results

## 2017-09-12 ENCOUNTER — Other Ambulatory Visit: Payer: Self-pay

## 2017-09-12 ENCOUNTER — Encounter: Payer: Self-pay | Admitting: *Deleted

## 2017-09-12 ENCOUNTER — Telehealth: Payer: Self-pay

## 2017-09-12 DIAGNOSIS — D649 Anemia, unspecified: Secondary | ICD-10-CM

## 2017-09-12 NOTE — Telephone Encounter (Signed)
-----   Message from [name], MD sent at 09/11/2017  8:39 AM EST ----- Let the patient know her red cell count was down a bit more with her white cell count now elevated. She should have a repeat CT scan of the abdomen and pelvis to make sure she does not have diverticulitis or an infection in her abdomen.

## 2017-09-12 NOTE — Telephone Encounter (Signed)
Pt notified of results

## 2017-09-13 ENCOUNTER — Ambulatory Visit: Payer: 59 | Admitting: Anesthesiology

## 2017-09-13 ENCOUNTER — Encounter
Admission: RE | Disposition: A | Discharge status: Home / Self Care | Payer: Self-pay | Source: Ambulatory Visit | Attending: Gastroenterology

## 2017-09-13 ENCOUNTER — Ambulatory Visit
Admission: RE | Admit: 2017-09-13 | Discharge: 2017-09-13 | Disposition: A | Discharge status: Home / Self Care | Payer: 59 | Source: Ambulatory Visit | Attending: Gastroenterology | Admitting: Gastroenterology

## 2017-09-13 DIAGNOSIS — D649 Anemia, unspecified: Secondary | ICD-10-CM | POA: Diagnosis not present

## 2017-09-13 DIAGNOSIS — I10 Essential (primary) hypertension: Secondary | ICD-10-CM | POA: Insufficient documentation

## 2017-09-13 DIAGNOSIS — K2970 Gastritis, unspecified, without bleeding: Secondary | ICD-10-CM | POA: Diagnosis not present

## 2017-09-13 DIAGNOSIS — R1084 Generalized abdominal pain: Secondary | ICD-10-CM

## 2017-09-13 DIAGNOSIS — F17210 Nicotine dependence, cigarettes, uncomplicated: Secondary | ICD-10-CM | POA: Diagnosis not present

## 2017-09-13 DIAGNOSIS — K449 Diaphragmatic hernia without obstruction or gangrene: Secondary | ICD-10-CM | POA: Diagnosis not present

## 2017-09-13 DIAGNOSIS — E7800 Pure hypercholesterolemia, unspecified: Secondary | ICD-10-CM | POA: Insufficient documentation

## 2017-09-13 DIAGNOSIS — Z79899 Other long term (current) drug therapy: Secondary | ICD-10-CM | POA: Insufficient documentation

## 2017-09-13 DIAGNOSIS — D509 Iron deficiency anemia, unspecified: Secondary | ICD-10-CM | POA: Diagnosis not present

## 2017-09-13 DIAGNOSIS — R51 Headache: Secondary | ICD-10-CM | POA: Diagnosis not present

## 2017-09-13 DIAGNOSIS — D122 Benign neoplasm of ascending colon: Secondary | ICD-10-CM

## 2017-09-13 HISTORY — PX: COLONOSCOPY WITH PROPOFOL: SHX5780

## 2017-09-13 HISTORY — PX: ESOPHAGOGASTRODUODENOSCOPY (EGD) WITH PROPOFOL: SHX5813

## 2017-09-13 HISTORY — PX: POLYPECTOMY: SHX5525

## 2017-09-13 SURGERY — COLONOSCOPY WITH PROPOFOL
Anesthesia: General | Wound class: Clean Contaminated

## 2017-09-13 MED ORDER — SODIUM CHLORIDE 0.9 % IV SOLN: INTRAVENOUS | Status: DC

## 2017-09-13 MED ORDER — LIDOCAINE HCL (CARDIAC) 20 MG/ML IV SOLN
INTRAVENOUS | Status: DC | PRN
  Administered 2017-09-13: 40 mg via INTRAVENOUS

## 2017-09-13 MED ORDER — ACETAMINOPHEN 325 MG PO TABS: 325.0000 mg | ORAL_TABLET | Freq: Once | ORAL | Status: DC

## 2017-09-13 MED ORDER — ACETAMINOPHEN 160 MG/5ML PO SOLN: 325.0000 mg | Freq: Once | ORAL | Status: DC

## 2017-09-13 MED ORDER — LACTATED RINGERS IV SOLN
INTRAVENOUS | Status: DC
  Administered 2017-09-13: 10:00:00 via INTRAVENOUS

## 2017-09-13 MED ORDER — PROPOFOL 10 MG/ML IV BOLUS
INTRAVENOUS | Status: DC | PRN
  Administered 2017-09-13: 30 mg via INTRAVENOUS
  Administered 2017-09-13 (×2): 20 mg via INTRAVENOUS
  Administered 2017-09-13: 30 mg via INTRAVENOUS
  Administered 2017-09-13: 20 mg via INTRAVENOUS
  Administered 2017-09-13 (×2): 30 mg via INTRAVENOUS
  Administered 2017-09-13 (×4): 20 mg via INTRAVENOUS
  Administered 2017-09-13: 30 mg via INTRAVENOUS
  Administered 2017-09-13 (×3): 20 mg via INTRAVENOUS
  Administered 2017-09-13: 80 mg via INTRAVENOUS
  Administered 2017-09-13 (×2): 20 mg via INTRAVENOUS
  Administered 2017-09-13: 30 mg via INTRAVENOUS
  Administered 2017-09-13 (×2): 20 mg via INTRAVENOUS

## 2017-09-13 MED ORDER — GLYCOPYRROLATE 0.2 MG/ML IJ SOLN
INTRAMUSCULAR | Status: DC | PRN
  Administered 2017-09-13: 0.2 mg via INTRAVENOUS

## 2017-09-13 SURGICAL SUPPLY — 35 items
BALLN DILATOR 10-12 8 (BALLOONS)
BALLN DILATOR 12-15 8 (BALLOONS)
BALLN DILATOR 15-18 8 (BALLOONS)
BALLN DILATOR CRE 0-12 8 (BALLOONS)
BALLN DILATOR ESOPH 8 10 CRE (MISCELLANEOUS) IMPLANT
BALLOON DILATOR 12-15 8 (BALLOONS) IMPLANT
BALLOON DILATOR 15-18 8 (BALLOONS) IMPLANT
BALLOON DILATOR CRE 0-12 8 (BALLOONS) IMPLANT
BLOCK BITE 60FR ADLT L/F GRN (MISCELLANEOUS) ×4 IMPLANT
CANISTER SUCT 1200ML W/VALVE (MISCELLANEOUS) ×4 IMPLANT
CLIP HMST 235XBRD CATH ROT (MISCELLANEOUS) IMPLANT
CLIP RESOLUTION 360 11X235 (MISCELLANEOUS)
FCP ESCP3.2XJMB 240X2.8X (MISCELLANEOUS)
FORCEPS BIOP RAD 4 LRG CAP 4 (CUTTING FORCEPS) ×4 IMPLANT
FORCEPS BIOP RJ4 240 W/NDL (MISCELLANEOUS)
FORCEPS ESCP3.2XJMB 240X2.8X (MISCELLANEOUS) IMPLANT
GOWN CVR UNV OPN BCK APRN NK (MISCELLANEOUS) ×4 IMPLANT
GOWN ISOL THUMB LOOP REG UNIV (MISCELLANEOUS) ×4
INJECTOR VARIJECT VIN23 (MISCELLANEOUS) IMPLANT
KIT DEFENDO VALVE AND CONN (KITS) IMPLANT
KIT ENDO PROCEDURE OLY (KITS) ×4 IMPLANT
MARKER SPOT ENDO TATTOO 5ML (MISCELLANEOUS) IMPLANT
PAD GROUND ADULT SPLIT (MISCELLANEOUS) IMPLANT
PROBE APC STR FIRE (PROBE) IMPLANT
RETRIEVER NET PLAT FOOD (MISCELLANEOUS) IMPLANT
RETRIEVER NET ROTH 2.5X230 LF (MISCELLANEOUS) IMPLANT
SNARE SHORT THROW 13M SML OVAL (MISCELLANEOUS) ×4 IMPLANT
SNARE SHORT THROW 30M LRG OVAL (MISCELLANEOUS) IMPLANT
SNARE SNG USE RND 15MM (INSTRUMENTS) IMPLANT
SPOT EX ENDOSCOPIC TATTOO (MISCELLANEOUS)
SYR INFLATION 60ML (SYRINGE) IMPLANT
TRAP ETRAP POLY (MISCELLANEOUS) ×4 IMPLANT
VARIJECT INJECTOR VIN23 (MISCELLANEOUS)
WATER STERILE IRR 250ML POUR (IV SOLUTION) ×4 IMPLANT
WIRE CRE 18-20MM 8CM F G (MISCELLANEOUS) IMPLANT

## 2017-09-13 NOTE — Op Note (Signed)
[facility] Patient Name: Jacqueline Harmon Procedure Date: 09/13/2017 11:01 AM MRN: [id number] Account #: [id number] Date of Birth: Feb 06, 1956 Admit Type: Outpatient Age: 61 Room: [facility] OR ROOM 01 Gender: Female Note Status: Finalized Procedure:            Colonoscopy Indications:          Generalized abdominal pain, Iron deficiency anemia Providers:            [name] MD, MD Medicines:            Propofol per Anesthesia Complications:        No immediate complications. Procedure:            Pre-Anesthesia Assessment:                       - Prior to the procedure, a History and Physical was                        performed, and patient medications and allergies were                        reviewed. The patient's tolerance of previous                        anesthesia was also reviewed. The risks and benefits of                        the procedure and the sedation options and risks were                        discussed with the patient. All questions were                        answered, and informed consent was obtained. Prior                        Anticoagulants: The patient has taken no previous                        anticoagulant or antiplatelet agents. ASA Grade                        Assessment: II - A patient with mild systemic disease.                        After reviewing the risks and benefits, the patient was                        deemed in satisfactory condition to undergo the                        procedure.                       After obtaining informed consent, the colonoscope was                        passed under direct vision. Throughout the procedure,                        the patient's blood  pressure, pulse, and oxygen                        saturations were monitored continuously. The [place] (S#: [id number]) was introduced through                        the anus and advanced  to the the cecum, identified by                        appendiceal orifice and ileocecal valve. The                        colonoscopy was performed without difficulty. The                        patient tolerated the procedure well. The quality of                        the bowel preparation was good. Findings:      The perianal and digital rectal examinations were normal.      A 4 mm polyp was found in the ascending colon. The polyp was sessile.       The polyp was removed with a cold snare. Resection and retrieval were       complete.      The mucosa vascular pattern in the entire colon was diffusely increased.       Random biopsies were obtained with cold forceps for histology randomly       in the entire colon.      Localized moderate inflammation characterized by erythema was found at       the ileocecal valve. Biopsies were taken with a cold forceps for       histology. Impression:           - One 4 mm polyp in the ascending colon, removed with a                        cold snare. Resected and retrieved.                       - Increased mucosa vascular pattern in the entire                        examined colon.                       - Localized moderate inflammation was found at the                        ileocecal valve. Biopsied.                       - Random biopsies were obtained in the entire colon. Recommendation:       - Discharge patient to home.                       - Resume previous diet.                       -  Continue present medications.                       - Await pathology results. Procedure Code(s):    --- Professional ---                       [phone], Colonoscopy, flexible; with removal of tumor(s),                        polyp(s), or other lesion(s) by snare technique                       45380, 60, Colonoscopy, flexible; with biopsy, single                        or multiple Diagnosis Code(s):    --- Professional ---                       R10.84,  Generalized abdominal pain                       D50.9, Iron deficiency anemia, unspecified                       D12.2, Benign neoplasm of ascending colon CPT copyright 2016 [employer] Medical Association. All rights reserved. The codes documented in this report are preliminary and upon coder review may  be revised to meet current compliance requirements. [name] MD, MD 09/13/2017 11:45:26 AM This report has been signed electronically. Number of Addenda: 0 Note Initiated On: 09/13/2017 11:01 AM Scope Withdrawal Time: 0 hours 10 minutes 49 seconds  Total Procedure Duration: 0 hours 18 minutes 24 seconds       [facility]

## 2017-09-13 NOTE — Op Note (Addendum)
[facility] Gastroenterology Patient Name: Jacqueline Harmon Procedure Date: 09/13/2017 11:01 AM MRN: [id number] Account #: [id number] Date of Birth: [birth year] Admit Type: Outpatient Age: 61 Room: [facility] OR ROOM 01 Gender: Female Note Status: Supervisor Override Procedure:            Upper GI endoscopy Indications:          Generalized abdominal pain, Iron deficiency anemia Providers:            [name] MD, MD Referring MD:         Halina Maidens, MD (Referring MD) Medicines:            Propofol per Anesthesia Complications:        No immediate complications. Procedure:            Pre-Anesthesia Assessment:                       - Prior to the procedure, a History and Physical was                        performed, and patient medications and allergies were                        reviewed. The patient's tolerance of previous                        anesthesia was also reviewed. The risks and benefits of                        the procedure and the sedation options and risks were                        discussed with the patient. All questions were                        answered, and informed consent was obtained. Prior                        Anticoagulants: The patient has taken no previous                        anticoagulant or antiplatelet agents. ASA Grade                        Assessment: II - A patient with mild systemic disease.                        After reviewing the risks and benefits, the patient was                        deemed in satisfactory condition to undergo the                        procedure.                       After obtaining informed consent, the endoscope was                        passed under direct vision. Throughout the procedure,  the patient's blood pressure, pulse, and oxygen                        saturations were monitored continuously. The Olympus                        GIF H180J Endoscope (S#: [id number])  was introduced                        through the mouth, and advanced to the second part of                        duodenum. The upper GI endoscopy was accomplished                        without difficulty. The patient tolerated the procedure                        well. Findings:      A small hiatal hernia was present.      Localized mild inflammation characterized by erythema was found in the       gastric antrum. Biopsies were taken with a cold forceps for histology.      The examined duodenum was normal. Biopsies were taken with a cold       forceps for histology. Impression:           - Small hiatal hernia.                       - Gastritis. Biopsied.                       - Normal examined duodenum. Biopsied. Recommendation:       - Discharge patient to home.                       - Resume previous diet.                       - Continue present medications.                       - Await pathology results.                       - Perform a colonoscopy today. Procedure Code(s):    --- Professional ---                       [phone], Esophagogastroduodenoscopy, flexible, transoral;                        with biopsy, single or multiple Diagnosis Code(s):    --- Professional ---                       R10.84, Generalized abdominal pain                       D50.9, Iron deficiency anemia, unspecified                       K29.70, Gastritis, unspecified, without bleeding CPT copyright 2018 American Medical Association. All rights reserved. The codes documented in  this report are preliminary and upon coder review may  be revised to meet current compliance requirements. [name] MD, MD 09/13/2017 11:19:54 AM This report has been signed electronically. Number of Addenda: 0 Note Initiated On: 09/13/2017 11:01 AM      [facility]

## 2017-09-13 NOTE — Anesthesia Preprocedure Evaluation (Signed)
Anesthesia Evaluation  Patient identified by MRN, date of birth, ID band Patient awake    Reviewed: Allergy & Precautions, H&P , NPO status , Patient's Chart, lab work & pertinent test results  Airway Mallampati: II  TM Distance: >3 FB Neck ROM: full    Dental no notable dental hx.    Pulmonary Current Smoker,    Pulmonary exam normal breath sounds clear to auscultation       Cardiovascular hypertension, Normal cardiovascular exam Rhythm:regular Rate:Normal     Neuro/Psych    GI/Hepatic   Endo/Other    Renal/GU      Musculoskeletal   Abdominal   Peds  Hematology   Anesthesia Other Findings   Reproductive/Obstetrics                             Anesthesia Physical Anesthesia Plan  ASA: II  Anesthesia Plan: General   Post-op Pain Management:    Induction: Intravenous  PONV Risk Score and Plan: 3 and Propofol infusion  Airway Management Planned: Natural Airway  Additional Equipment:   Intra-op Plan:   Post-operative Plan:   Informed Consent: I have reviewed the patients History and Physical, chart, labs and discussed the procedure including the risks, benefits and alternatives for the proposed anesthesia with the patient or authorized representative who has indicated his/her understanding and acceptance.     Plan Discussed with: CRNA  Anesthesia Plan Comments:         Anesthesia Quick Evaluation

## 2017-09-13 NOTE — Anesthesia Procedure Notes (Signed)
Performed by: Ambre Kobayashi, CRNA Pre-anesthesia Checklist: Patient identified, Emergency Drugs available, Suction available, Timeout performed and Patient being monitored Patient Re-evaluated:Patient Re-evaluated prior to induction Oxygen Delivery Method: Nasal cannula Placement Confirmation: positive ETCO2       

## 2017-09-13 NOTE — H&P (Signed)
[name], MD [facility] [address], [place], [place] [address] [phone] Fax : [phone]  Primary Care Physician:  Glean Hess, MD Primary Gastroenterologist:  Dr. Allen Norris  Pre-Procedure History & Physical: HPI:  Jacqueline Harmon is a 61 y.o. female is here for an endoscopy and colonoscopy.   Past Medical History:  Diagnosis Date  . Headache    migraines - 1x/mo - better since topamax  . Hypercholesteremia   . Hypertension   . Multilevel degenerative disc disease   . Wears dentures    partial upper and lower    Past Surgical History:  Procedure Laterality Date  . ANTERIOR CERVICAL DISCECTOMY  2010  . BREAST BIOPSY Left 08/2014   papilloma  . COLONOSCOPY WITH PROPOFOL N/A 08/02/2016   Procedure: COLONOSCOPY WITH PROPOFOL;  Surgeon: [name], MD;  Location: [place];  Service: Endoscopy;  Laterality: N/A;  . ENDOSCOPIC HEMILAMINOTOMY W/ DISCECTOMY LUMBAR  2009, 2011  . ESOPHAGEAL DILATION  08/02/2016   Procedure: ESOPHAGEAL DILATION;  Surgeon: [name], MD;  Location: [place];  Service: Endoscopy;;  . ESOPHAGOGASTRODUODENOSCOPY (EGD) WITH PROPOFOL N/A 08/02/2016   Repeat EGD 08/2019. Performed by Dr. [name]    Prior to Admission medications   Medication Sig Start Date End Date Taking? Authorizing Provider  albuterol (PROVENTIL HFA;VENTOLIN HFA) 108 (90 Base) MCG/ACT inhaler Inhale 2 puffs into the lungs every 6 (six) hours as needed for wheezing or shortness of breath. 10/12/16  Yes Glean Hess, MD  atorvastatin (LIPITOR) 10 MG tablet TAKE 1 TABLET BY MOUTH EVERY DAY 09/10/17  Yes Glean Hess, MD  buPROPion [facility] SR) 150 MG 12 hr tablet Take 1 tablet (150 mg total) by mouth 2 (two) times daily. 06/14/17  Yes Glean Hess, MD  Butalbital-APAP-Caffeine 50-300-40 MG CAPS Take 1 capsule by mouth 2 (two) times daily as needed. migraine 03/23/15  Yes Glean Hess, MD  dexlansoprazole (DEXILANT) 60 MG  capsule Take 1 capsule (60 mg total) by mouth daily. 06/14/17  Yes Glean Hess, MD  HYDROcodone-acetaminophen (NORCO) 5-325 MG tablet Take 1 tablet by mouth every 6 (six) hours as needed for severe pain. 07/29/17  Yes Darel Hong, MD  irbesartan (AVAPRO) 150 MG tablet TAKE 1 TABLET (150 MG TOTAL) BY MOUTH DAILY. 06/10/17  Yes Glean Hess, MD  spironolactone (ALDACTONE) 50 MG tablet TAKE 1 TABLET (50 MG TOTAL) BY MOUTH DAILY. 09/10/17  Yes Glean Hess, MD  topiramate (TOPAMAX) 25 MG tablet TAKE 2 TABLETS (50 MG TOTAL) BY MOUTH AT BEDTIME. 06/10/17  Yes Glean Hess, MD    Allergies as of 09/12/2017 - Review Complete 09/12/2017  Allergen Reaction Noted  . Levofloxacin Shortness Of Breath 03/23/2015  . Ace inhibitors Cough 03/23/2015  . Prednisone Itching 04/04/2016    Family History  Problem Relation Age of Onset  . Breast cancer Mother 63  . Cancer Mother   . Diabetes Father   . Osteoarthritis Father   . Breast cancer Maternal Grandmother 90    Social History   Socioeconomic History  . Marital status: Married    Spouse name: Not on file  . Number of children: Not on file  . Years of education: Not on file  . Highest education level: Not on file  Social Needs  . Financial resource strain: Not on file  . Food insecurity - worry: Not on file  . Food insecurity - inability: Not on file  . Transportation needs -  medical: Not on file  . Transportation needs - non-medical: Not on file  Occupational History  . Occupation: Paralegal  Tobacco Use  . Smoking status: Current Every Day Smoker    Packs/day: 0.50    Years: 20.00    Pack years: 10.00    Types: Cigarettes  . Smokeless tobacco: Never Used  Substance and Sexual Activity  . Alcohol use: Yes    Alcohol/week: 0.0 oz    Comment: occasionally - several times/year  . Drug use: No  . Sexual activity: Not on file  Other Topics Concern  . Not on file  Social History Narrative  . Not on file    Review  of Systems: See HPI, otherwise negative ROS  Physical Exam: BP 140/69   Pulse 86   Temp (!) 97.2 F (36.2 C) (Temporal)   Resp 16   Ht 5\' 5"  (1.651 m)   Wt 177 lb (80.3 kg)   SpO2 100%   BMI 29.45 kg/m  General:   Alert,  pleasant and cooperative in NAD Head:  Normocephalic and atraumatic. Neck:  Supple; no masses or thyromegaly. Lungs:  Clear throughout to auscultation.    Heart:  Regular rate and rhythm. Abdomen:  Soft, nontender and nondistended. Normal bowel sounds, without guarding, and without rebound.   Neurologic:  Alert and  oriented x4;  grossly normal neurologically.  Impression/Plan: Jacqueline Harmon is here for an endoscopy and colonoscopy to be performed for anemia with generalized abd pain  Risks, benefits, limitations, and alternatives regarding  endoscopy and colonoscopy have been reviewed with the patient.  Questions have been answered.  All parties agreeable.   [name], MD  09/13/2017, 10:28 AM

## 2017-09-13 NOTE — Anesthesia Postprocedure Evaluation (Signed)
Anesthesia Post Note  Patient: Jacqueline Harmon  Procedure(s) Performed: COLONOSCOPY WITH PROPOFOL (N/A ) ESOPHAGOGASTRODUODENOSCOPY (EGD) WITH PROPOFOL (N/A ) POLYPECTOMY  Patient location during evaluation: PACU Anesthesia Type: General Level of consciousness: awake and alert and oriented Pain management: satisfactory to patient Vital Signs Assessment: post-procedure vital signs reviewed and stable Respiratory status: spontaneous breathing, nonlabored ventilation and respiratory function stable Cardiovascular status: blood pressure returned to baseline and stable Postop Assessment: Adequate PO intake and No signs of nausea or vomiting Anesthetic complications: no    Raliegh Ip

## 2017-09-13 NOTE — Transfer of Care (Signed)
Immediate Anesthesia Transfer of Care Note  Patient: Jacqueline Harmon  Procedure(s) Performed: COLONOSCOPY WITH PROPOFOL (N/A ) ESOPHAGOGASTRODUODENOSCOPY (EGD) WITH PROPOFOL (N/A )  Patient Location: PACU  Anesthesia Type: General  Level of Consciousness: awake, alert  and patient cooperative  Airway and Oxygen Therapy: Patient Spontanous Breathing and Patient connected to supplemental oxygen  Post-op Assessment: Post-op Vital signs reviewed, Patient's Cardiovascular Status Stable, Respiratory Function Stable, Patent Airway and No signs of Nausea or vomiting  Post-op Vital Signs: Reviewed and stable  Complications: No apparent anesthesia complications

## 2017-09-14 ENCOUNTER — Encounter: Payer: Self-pay | Admitting: Gastroenterology

## 2017-09-17 ENCOUNTER — Encounter: Payer: Self-pay | Admitting: Gastroenterology

## 2017-09-18 ENCOUNTER — Other Ambulatory Visit: Payer: Self-pay | Admitting: Internal Medicine

## 2017-09-18 DIAGNOSIS — F419 Anxiety disorder, unspecified: Secondary | ICD-10-CM

## 2017-09-18 MED ORDER — BUPROPION HCL ER (SR) 150 MG PO TB12: 150.0000 mg | ORAL_TABLET | Freq: Two times a day (BID) | ORAL | 1 refills | Status: DC

## 2017-09-25 ENCOUNTER — Telehealth: Payer: Self-pay

## 2017-09-25 NOTE — Telephone Encounter (Signed)
-----   Message from [name], MD sent at 09/18/2017  9:01 AM EST ----- Please have the patient come in for a follow up.

## 2017-09-25 NOTE — Telephone Encounter (Signed)
Pt scheduled for a follow up appt to discuss procedure results.

## 2017-09-26 ENCOUNTER — Ambulatory Visit: Payer: 59 | Admitting: Gastroenterology

## 2017-09-26 ENCOUNTER — Encounter: Payer: Self-pay | Admitting: Gastroenterology

## 2017-09-26 VITALS — BP 142/67 | HR 88 | Ht 65.0 in | Wt 184.0 lb

## 2017-09-26 DIAGNOSIS — K2940 Chronic atrophic gastritis without bleeding: Secondary | ICD-10-CM | POA: Diagnosis not present

## 2017-09-26 MED ORDER — PROMETHAZINE HCL 25 MG PO TABS: 25.0000 mg | ORAL_TABLET | Freq: Four times a day (QID) | ORAL | 3 refills | Status: AC | PRN

## 2017-09-26 MED ORDER — BUDESONIDE 3 MG PO CPEP: 9.0000 mg | ORAL_CAPSULE | ORAL | 1 refills | Status: DC

## 2017-09-26 NOTE — Progress Notes (Signed)
Primary Care Physician: Jacqueline Hess, MD  Primary Gastroenterologist:  Dr. Lucilla Harmon  Chief Complaint  Patient presents with  . Follow up colonoscopy results    HPI: Jacqueline Harmon is a 61 y.o. female here for follow-up of her intestinal biopsies.  The patient had normal small bowel biopsies but was found to have increased eosinophils with mild active colitis.  The patient also had biopsies of the stomach consistent with intestinal metaplasia and chronic gastritis.  The patient reports that she continues to have nausea and had an episode of vomiting in the middle night that woke her up.  The patient is presently taking Dexilant.  Current Outpatient Medications  Medication Sig Dispense Refill  . albuterol (PROVENTIL HFA;VENTOLIN HFA) 108 (90 Base) MCG/ACT inhaler Inhale 2 puffs into the lungs every 6 (six) hours as needed for wheezing or shortness of breath. 1 Inhaler 0  . atorvastatin (LIPITOR) 10 MG tablet TAKE 1 TABLET BY MOUTH EVERY DAY 90 tablet 2  . buPROPion (WELLBUTRIN SR) 150 MG 12 hr tablet Take 1 tablet (150 mg total) by mouth 2 (two) times daily. 180 tablet 1  . Butalbital-APAP-Caffeine 50-300-40 MG CAPS Take 1 capsule by mouth 2 (two) times daily as needed. migraine 60 capsule 5  . dexlansoprazole (DEXILANT) 60 MG capsule Take 1 capsule (60 mg total) by mouth daily. 90 capsule 3  . HYDROcodone-acetaminophen (NORCO) 5-325 MG tablet Take 1 tablet by mouth every 6 (six) hours as needed for severe pain. 15 tablet 0  . irbesartan (AVAPRO) 150 MG tablet TAKE 1 TABLET (150 MG TOTAL) BY MOUTH DAILY. 90 tablet 1  . spironolactone (ALDACTONE) 50 MG tablet TAKE 1 TABLET (50 MG TOTAL) BY MOUTH DAILY. 90 tablet 2  . topiramate (TOPAMAX) 25 MG tablet TAKE 2 TABLETS (50 MG TOTAL) BY MOUTH AT BEDTIME. 180 tablet 1  . budesonide (ENTOCORT EC) 3 MG 24 hr capsule Take 3 capsules (9 mg total) by mouth every morning. 90 capsule 1  . promethazine (PHENERGAN) 25 MG tablet Take 1 tablet (25  mg total) by mouth every 6 (six) hours as needed for nausea or vomiting. 30 tablet 3   No current facility-administered medications for this visit.     Allergies as of 09/26/2017 - Review Complete 09/26/2017  Allergen Reaction Noted  . Levofloxacin Shortness Of Breath 03/23/2015  . Ace inhibitors Cough 03/23/2015  . Prednisone Itching 04/04/2016    ROS:  General: Negative for anorexia, weight loss, fever, chills, fatigue, weakness. ENT: Negative for hoarseness, difficulty swallowing , nasal congestion. CV: Negative for chest pain, angina, palpitations, dyspnea on exertion, peripheral edema.  Respiratory: Negative for dyspnea at rest, dyspnea on exertion, cough, sputum, wheezing.  GI: See history of present illness. GU:  Negative for dysuria, hematuria, urinary incontinence, urinary frequency, nocturnal urination.  Endo: Negative for unusual weight change.    Physical Examination:   BP (!) 142/67   Pulse 88   Ht 5\' 5"  (1.651 m)   Wt 184 lb (83.5 kg)   BMI 30.62 kg/m   General: Well-nourished, well-developed in no acute distress.  Eyes: No icterus. Conjunctivae pink. Extremities: No lower extremity edema. No clubbing or deformities. Neuro: Alert and oriented x 3.  Grossly intact. Skin: Warm and dry, no jaundice.   Psych: Alert and cooperative, normal mood and affect.  Labs:    Imaging Studies: Ct Abdomen Pelvis W Contrast  Result Date: 09/11/2017 CLINICAL DATA:  Leukocytosis. Anemia. Left-sided abdominal pain with dark stool. EXAM: CT  ABDOMEN AND PELVIS WITH CONTRAST TECHNIQUE: Multidetector CT imaging of the abdomen and pelvis was performed using the standard protocol following bolus administration of intravenous contrast. CONTRAST:  128mL ISOVUE-300 IOPAMIDOL (ISOVUE-300) INJECTION 61% COMPARISON:  07/29/2017 FINDINGS: Lower chest: Stable left lower lobe scarring. Hepatobiliary: Unremarkable Pancreas: Unremarkable Spleen: Unremarkable Adrenals/Urinary Tract: Unremarkable  Stomach/Bowel: Sigmoid diverticulosis without active diverticulitis. Scattered mostly small pericecal lymph nodes withan ileocolic node measuring 1.2 cm 1.1 cm in short axis on image 40/2, and a similar size back through 04/07/2013. Appendix normal. Fat deposition in the proximal colon wall, which is typically incidental but does have a weak association with inflammatory bowel disease. Terminal ileum appears unremarkable. Vascular/Lymphatic: Aortoiliac atherosclerotic vascular disease. Small porta hepatis lymph nodes are not pathologically enlarged. Reproductive: Unremarkable Other: No supplemental non-categorized findings. Musculoskeletal: There is abnormal thickening along the left iliotibial band, new compared to 10/20 2/18, and with some overlying subcutaneous edema as shown on images 63-83 of series 2. There is potentially some underlying thickening of the gluteus medius muscle. Lumbar spondylosis and degenerative disc disease resulting in mild left foraminal stenosis at L5-S1. IMPRESSION: 1. Abnormal thickening along the left iliotibial band (lateral to the hip) with some overlying subcutaneous edema and some potential underlying thickening of the gluteus medius muscle. The most likely cause would be trauma, with infection a less likely differential diagnostic consideration. If pain or masslike feeling persists, it can indicate a Morel-Lavallee lesion which often sometimes requires surgical intervention, but right now the iliotibial band mainly appears thickened rather than the masslike appearance of a Morel-Lavallee lesion. No appreciable abscess in this vicinity. 2. Stable mild sigmoid diverticulosis without evidence of active diverticulitis. A specific source for GI bleeding is not identified. 3. Stable mildly prominent ileocolic node at 1.1 cm in short axis. This has measured of similar size back through 2014. 4.  Aortic Atherosclerosis (ICD10-I70.0). 5. Left-sided impingement at L5-S1 due to spondylosis  and degenerative disc disease. Electronically Signed   By: Van Clines M.D.   On: 09/11/2017 13:20    Assessment and Plan:   Jacqueline Harmon is a 61 y.o. y/o female who comes for follow-up after having an EGD and colonoscopy for chronic abdominal pain in the left side of her abdomen with nausea.  The patient will be given a refill of her Phenergan.  The patient will also be started on budesonide for her increased eosinophils with mild chronic colitis.  The patient has also been told about her intestinal metaplasia seen on her gastric biopsies and will be set up for repeat EGD with gastric mapping. I have discussed risks & benefits which include, but are not limited to, bleeding, infection, perforation & drug reaction.  The patient agrees with this plan & written consent will be obtained.       Jacqueline Lame, MD. Marval Regal   Note: This dictation was prepared with Dragon dictation along with smaller phrase technology. Any transcriptional errors that result from this process are unintentional.

## 2017-10-02 ENCOUNTER — Encounter: Payer: Self-pay | Admitting: *Deleted

## 2017-10-02 ENCOUNTER — Other Ambulatory Visit: Payer: Self-pay

## 2017-10-03 NOTE — Discharge Instructions (Signed)
General Anesthesia, Adult, Care After °These instructions provide you with information about caring for yourself after your procedure. Your health care provider may also give you more specific instructions. Your treatment has been planned according to current medical practices, but problems sometimes occur. Call your health care provider if you have any problems or questions after your procedure. °What can I expect after the procedure? °After the procedure, it is common to have: °· Vomiting. °· A sore throat. °· Mental slowness. ° °It is common to feel: °· Nauseous. °· Cold or shivery. °· Sleepy. °· Tired. °· Sore or achy, even in parts of your body where you did not have surgery. ° °Follow these instructions at home: °For at least 24 hours after the procedure: °· Do not: °? Participate in activities where you could fall or become injured. °? Drive. °? Use heavy machinery. °? Drink alcohol. °? Take sleeping pills or medicines that cause drowsiness. °? Make important decisions or sign legal documents. °? Take care of children on your own. °· Rest. °Eating and drinking °· If you vomit, drink water, juice, or soup when you can drink without vomiting. °· Drink enough fluid to keep your urine clear or pale yellow. °· Make sure you have little or no nausea before eating solid foods. °· Follow the diet recommended by your health care provider. °General instructions °· Have a responsible adult stay with you until you are awake and alert. °· Return to your normal activities as told by your health care provider. Ask your health care provider what activities are safe for you. °· Take over-the-counter and prescription medicines only as told by your health care provider. °· If you smoke, do not smoke without supervision. °· Keep all follow-up visits as told by your health care provider. This is important. °Contact a health care provider if: °· You continue to have nausea or vomiting at home, and medicines are not helpful. °· You  cannot drink fluids or start eating again. °· You cannot urinate after 8-12 hours. °· You develop a skin rash. °· You have fever. °· You have increasing redness at the site of your procedure. °Get help right away if: °· You have difficulty breathing. °· You have chest pain. °· You have unexpected bleeding. °· You feel that you are having a life-threatening or urgent problem. °This information is not intended to replace advice given to you by your health care provider. Make sure you discuss any questions you have with your health care provider. °Document Released: 12/31/2000 Document Revised: 02/27/2016 Document Reviewed: 09/08/2015 °Elsevier Interactive Patient Education © 2018 [employer] ° °

## 2017-10-04 ENCOUNTER — Encounter
Admission: RE | Disposition: A | Discharge status: Home / Self Care | Payer: Self-pay | Source: Ambulatory Visit | Attending: Gastroenterology

## 2017-10-04 ENCOUNTER — Ambulatory Visit: Payer: 59 | Admitting: Anesthesiology

## 2017-10-04 ENCOUNTER — Ambulatory Visit
Admission: RE | Admit: 2017-10-04 | Discharge: 2017-10-04 | Disposition: A | Discharge status: Home / Self Care | Payer: 59 | Source: Ambulatory Visit | Attending: Gastroenterology | Admitting: Gastroenterology

## 2017-10-04 DIAGNOSIS — K2940 Chronic atrophic gastritis without bleeding: Secondary | ICD-10-CM

## 2017-10-04 DIAGNOSIS — K3189 Other diseases of stomach and duodenum: Secondary | ICD-10-CM | POA: Insufficient documentation

## 2017-10-04 DIAGNOSIS — K2970 Gastritis, unspecified, without bleeding: Secondary | ICD-10-CM

## 2017-10-04 DIAGNOSIS — Z79899 Other long term (current) drug therapy: Secondary | ICD-10-CM | POA: Insufficient documentation

## 2017-10-04 DIAGNOSIS — K449 Diaphragmatic hernia without obstruction or gangrene: Secondary | ICD-10-CM | POA: Diagnosis not present

## 2017-10-04 DIAGNOSIS — F17210 Nicotine dependence, cigarettes, uncomplicated: Secondary | ICD-10-CM | POA: Diagnosis not present

## 2017-10-04 DIAGNOSIS — I10 Essential (primary) hypertension: Secondary | ICD-10-CM | POA: Insufficient documentation

## 2017-10-04 DIAGNOSIS — E7800 Pure hypercholesterolemia, unspecified: Secondary | ICD-10-CM | POA: Diagnosis not present

## 2017-10-04 HISTORY — PX: ESOPHAGOGASTRODUODENOSCOPY (EGD) WITH PROPOFOL: SHX5813

## 2017-10-04 SURGERY — ESOPHAGOGASTRODUODENOSCOPY (EGD) WITH PROPOFOL
Anesthesia: General | Wound class: Clean Contaminated

## 2017-10-04 MED ORDER — SODIUM CHLORIDE 0.9 % IV SOLN
INTRAVENOUS | Status: DC
Start: 2017-10-04 — End: 2017-10-04

## 2017-10-04 MED ORDER — ACETAMINOPHEN 160 MG/5ML PO SOLN: 325.0000 mg | Freq: Once | ORAL | Status: DC

## 2017-10-04 MED ORDER — GLYCOPYRROLATE 0.2 MG/ML IJ SOLN
INTRAMUSCULAR | Status: DC | PRN
  Administered 2017-10-04: 0.1 mg via INTRAVENOUS

## 2017-10-04 MED ORDER — ACETAMINOPHEN 325 MG PO TABS: 325.0000 mg | ORAL_TABLET | Freq: Once | ORAL | Status: DC

## 2017-10-04 MED ORDER — MESALAMINE ER 0.375 G PO CP24: 1.5000 g | ORAL_CAPSULE | Freq: Every day | ORAL | 3 refills | Status: DC

## 2017-10-04 MED ORDER — LACTATED RINGERS IV SOLN
INTRAVENOUS | Status: DC
  Administered 2017-10-04: 10:00:00 via INTRAVENOUS

## 2017-10-04 MED ORDER — LIDOCAINE HCL (CARDIAC) 20 MG/ML IV SOLN
INTRAVENOUS | Status: DC | PRN
  Administered 2017-10-04: 50 mg via INTRAVENOUS

## 2017-10-04 MED ORDER — PROPOFOL 10 MG/ML IV BOLUS
INTRAVENOUS | Status: DC | PRN
  Administered 2017-10-04: 100 mg via INTRAVENOUS
  Administered 2017-10-04: 50 mg via INTRAVENOUS
  Administered 2017-10-04: 30 mg via INTRAVENOUS

## 2017-10-04 SURGICAL SUPPLY — 32 items
BALLN DILATOR 10-12 8 (BALLOONS)
BALLN DILATOR 12-15 8 (BALLOONS)
BALLN DILATOR 15-18 8 (BALLOONS)
BALLN DILATOR CRE 0-12 8 (BALLOONS)
BALLN DILATOR ESOPH 8 10 CRE (MISCELLANEOUS) IMPLANT
BALLOON DILATOR 12-15 8 (BALLOONS) IMPLANT
BALLOON DILATOR 15-18 8 (BALLOONS) IMPLANT
BALLOON DILATOR CRE 0-12 8 (BALLOONS) IMPLANT
BLOCK BITE 60FR ADLT L/F GRN (MISCELLANEOUS) ×3 IMPLANT
CANISTER SUCT 1200ML W/VALVE (MISCELLANEOUS) ×3 IMPLANT
CLIP HMST 235XBRD CATH ROT (MISCELLANEOUS) IMPLANT
CLIP RESOLUTION 360 11X235 (MISCELLANEOUS)
FCP ESCP3.2XJMB 240X2.8X (MISCELLANEOUS) ×1
FORCEPS BIOP RAD 4 LRG CAP 4 (CUTTING FORCEPS) IMPLANT
FORCEPS BIOP RJ4 240 W/NDL (MISCELLANEOUS) ×2
FORCEPS ESCP3.2XJMB 240X2.8X (MISCELLANEOUS) ×1 IMPLANT
GOWN CVR UNV OPN BCK APRN NK (MISCELLANEOUS) ×2 IMPLANT
GOWN ISOL THUMB LOOP REG UNIV (MISCELLANEOUS) ×4
INJECTOR VARIJECT VIN23 (MISCELLANEOUS) IMPLANT
KIT DEFENDO VALVE AND CONN (KITS) IMPLANT
KIT ENDO PROCEDURE OLY (KITS) ×3 IMPLANT
MARKER SPOT ENDO TATTOO 5ML (MISCELLANEOUS) IMPLANT
PAD GROUND ADULT SPLIT (MISCELLANEOUS) IMPLANT
RETRIEVER NET PLAT FOOD (MISCELLANEOUS) IMPLANT
SNARE SHORT THROW 13M SML OVAL (MISCELLANEOUS) IMPLANT
SNARE SHORT THROW 30M LRG OVAL (MISCELLANEOUS) IMPLANT
SPOT EX ENDOSCOPIC TATTOO (MISCELLANEOUS)
SYR INFLATION 60ML (SYRINGE) IMPLANT
TRAP ETRAP POLY (MISCELLANEOUS) IMPLANT
VARIJECT INJECTOR VIN23 (MISCELLANEOUS)
WATER STERILE IRR 250ML POUR (IV SOLUTION) ×3 IMPLANT
WIRE CRE 18-20MM 8CM F G (MISCELLANEOUS) IMPLANT

## 2017-10-04 NOTE — Transfer of Care (Signed)
Immediate Anesthesia Transfer of Care Note  Patient: MILLIANNA SZYMBORSKI  Procedure(s) Performed: ESOPHAGOGASTRODUODENOSCOPY (EGD) WITH PROPOFOL (N/A )  Patient Location: PACU  Anesthesia Type: General  Level of Consciousness: awake, alert  and patient cooperative  Airway and Oxygen Therapy: Patient Spontanous Breathing and Patient connected to supplemental oxygen  Post-op Assessment: Post-op Vital signs reviewed, Patient's Cardiovascular Status Stable, Respiratory Function Stable, Patent Airway and No signs of Nausea or vomiting  Post-op Vital Signs: Reviewed and stable  Complications: No apparent anesthesia complications

## 2017-10-04 NOTE — Anesthesia Procedure Notes (Signed)
Date/Time: 10/04/2017 10:43 AM Performed by: Cameron Ali, CRNA Pre-anesthesia Checklist: Patient identified, Emergency Drugs available, Suction available, Timeout performed and Patient being monitored Patient Re-evaluated:Patient Re-evaluated prior to induction Oxygen Delivery Method: Nasal cannula Placement Confirmation: positive ETCO2

## 2017-10-04 NOTE — Op Note (Addendum)
[facility] Patient Name: Jacqueline Harmon Procedure Date: 10/04/2017 10:38 AM MRN: [id number] Account #: [id number] Date of Birth: [birth year] Admit Type: Outpatient Age: 61 Room: [facility] OR ROOM 01 Gender: Female Note Status: Supervisor Override Procedure:            Upper GI endoscopy Indications:          Follow-up of intestinal metaplasia Providers:            [name] MD, MD Referring MD:         Halina Maidens, MD (Referring MD) Medicines:            Propofol per Anesthesia Complications:        No immediate complications. Procedure:            Pre-Anesthesia Assessment:                       - Prior to the procedure, a History and Physical was                        performed, and patient medications and allergies were                        reviewed. The patient's tolerance of previous                        anesthesia was also reviewed. The risks and benefits of                        the procedure and the sedation options and risks were                        discussed with the patient. All questions were                        answered, and informed consent was obtained. Prior                        Anticoagulants: The patient has taken no previous                        anticoagulant or antiplatelet agents. ASA Grade                        Assessment: II - A patient with mild systemic disease.                        After reviewing the risks and benefits, the patient was                        deemed in satisfactory condition to undergo the                        procedure.                       After obtaining informed consent, the endoscope was                        passed under direct vision. Throughout the procedure,  the patient's blood pressure, pulse, and oxygen                        saturations were monitored continuously. The Olympus                        GIF-HQ190 Endoscope (S#. [id number]) was introduced                         through the mouth, and advanced to the second part of                        duodenum. The upper GI endoscopy was accomplished                        without difficulty. The patient tolerated the procedure                        well. Findings:      A small hiatal hernia was present.      Localized mild inflammation characterized by erythema was found in the       gastric antrum. Biopsies were taken with a cold forceps for histology.      The examined duodenum was normal. Impression:           - Small hiatal hernia.                       - Gastritis. Biopsied.                       - Normal examined duodenum.                       - Gastric mapping for intestinal metaplasia preformed. Recommendation:       - Await pathology results.                       - Discharge patient to home.                       - Resume previous diet.                       - Continue present medications.                       - Await pathology results.                       - Repeat upper endoscopy in 2 years for surveillance. Procedure Code(s):    --- Professional ---                       [phone], Esophagogastroduodenoscopy, flexible, transoral;                        with biopsy, single or multiple Diagnosis Code(s):    --- Professional ---                       K31.89, Other diseases of stomach and duodenum  K29.70, Gastritis, unspecified, without bleeding CPT copyright 2018 American Medical Association. All rights reserved. The codes documented in this report are preliminary and upon coder review may  be revised to meet current compliance requirements. [name] MD, MD 10/04/2017 10:56:47 AM This report has been signed electronically. Number of Addenda: 0 Note Initiated On: 10/04/2017 10:38 AM      [facility]

## 2017-10-04 NOTE — Anesthesia Preprocedure Evaluation (Signed)
Anesthesia Evaluation  Patient identified by MRN, date of birth, ID band Patient awake    Reviewed: Allergy & Precautions, H&P , NPO status , Patient's Chart, lab work & pertinent test results  Airway Mallampati: II  TM Distance: >3 FB Neck ROM: full    Dental no notable dental hx.    Pulmonary Current Smoker,    Pulmonary exam normal breath sounds clear to auscultation       Cardiovascular hypertension, Normal cardiovascular exam Rhythm:regular Rate:Normal     Neuro/Psych    GI/Hepatic   Endo/Other    Renal/GU      Musculoskeletal   Abdominal   Peds  Hematology   Anesthesia Other Findings   Reproductive/Obstetrics                             Anesthesia Physical Anesthesia Plan  ASA: II  Anesthesia Plan: General   Post-op Pain Management:    Induction: Intravenous  PONV Risk Score and Plan: 3 and Propofol infusion and Treatment may vary due to age or medical condition  Airway Management Planned: Natural Airway  Additional Equipment:   Intra-op Plan:   Post-operative Plan:   Informed Consent: I have reviewed the patients History and Physical, chart, labs and discussed the procedure including the risks, benefits and alternatives for the proposed anesthesia with the patient or authorized representative who has indicated his/her understanding and acceptance.       Plan Discussed with: CRNA  Anesthesia Plan Comments:         Anesthesia Quick Evaluation  

## 2017-10-04 NOTE — Anesthesia Postprocedure Evaluation (Signed)
Anesthesia Post Note  Patient: Jacqueline Harmon  Procedure(s) Performed: ESOPHAGOGASTRODUODENOSCOPY (EGD) WITH PROPOFOL (N/A )  Patient location during evaluation: PACU Anesthesia Type: General Level of consciousness: awake and alert and oriented Pain management: satisfactory to patient Vital Signs Assessment: post-procedure vital signs reviewed and stable Respiratory status: spontaneous breathing, nonlabored ventilation and respiratory function stable Cardiovascular status: blood pressure returned to baseline and stable Postop Assessment: Adequate PO intake and No signs of nausea or vomiting Anesthetic complications: no    Raliegh Ip

## 2017-10-04 NOTE — H&P (Signed)
[name], MD [address] [place], [place] [address] Phone:[phone] Fax : [phone]  Primary Care Physician:  Glean Hess, MD Primary Gastroenterologist:  Dr. Allen Norris  Pre-Procedure History & Physical: HPI:  Jacqueline Harmon is a 61 y.o. female is here for an endoscopy.   Past Medical History:  Diagnosis Date  . Headache    migraines - 1x/mo - better since topamax  . Hypercholesteremia   . Hypertension   . Multilevel degenerative disc disease   . Wears dentures    partial upper and lower    Past Surgical History:  Procedure Laterality Date  . ANTERIOR CERVICAL DISCECTOMY  2010  . BREAST BIOPSY Left 08/2014   papilloma  . COLONOSCOPY WITH PROPOFOL N/A 08/02/2016   Procedure: COLONOSCOPY WITH PROPOFOL;  Surgeon: [name], MD;  Location: [place];  Service: Endoscopy;  Laterality: N/A;  . COLONOSCOPY WITH PROPOFOL N/A 09/13/2017   Procedure: COLONOSCOPY WITH PROPOFOL;  Surgeon: [name], MD;  Location: [place];  Service: Endoscopy;  Laterality: N/A;  . ENDOSCOPIC HEMILAMINOTOMY W/ DISCECTOMY LUMBAR  2009, 2011  . ESOPHAGEAL DILATION  08/02/2016   Procedure: ESOPHAGEAL DILATION;  Surgeon: [name], MD;  Location: [place];  Service: Endoscopy;;  . ESOPHAGOGASTRODUODENOSCOPY (EGD) WITH PROPOFOL N/A 08/02/2016   Repeat EGD 08/2019. Performed by Dr. [name]  . ESOPHAGOGASTRODUODENOSCOPY (EGD) WITH PROPOFOL N/A 09/13/2017   Procedure: ESOPHAGOGASTRODUODENOSCOPY (EGD) WITH PROPOFOL;  Surgeon: [name], MD;  Location: [place];  Service: Endoscopy;  Laterality: N/A;  . POLYPECTOMY  09/13/2017   Procedure: POLYPECTOMY;  Surgeon: [name], MD;  Location: [place];  Service: Endoscopy;;    Prior to Admission medications   Medication Sig Start Date End Date Taking? Authorizing Provider  albuterol (PROVENTIL HFA;VENTOLIN HFA) 108 (90 Base) MCG/ACT inhaler Inhale 2 puffs into the  lungs every 6 (six) hours as needed for wheezing or shortness of breath. 10/12/16  Yes Glean Hess, MD  atorvastatin (LIPITOR) 10 MG tablet TAKE 1 TABLET BY MOUTH EVERY DAY 09/10/17  Yes Glean Hess, MD  buPROPion [facility] SR) 150 MG 12 hr tablet Take 1 tablet (150 mg total) by mouth 2 (two) times daily. 09/18/17  Yes Glean Hess, MD  Butalbital-APAP-Caffeine 50-300-40 MG CAPS Take 1 capsule by mouth 2 (two) times daily as needed. migraine 03/23/15  Yes Glean Hess, MD  dexlansoprazole (DEXILANT) 60 MG capsule Take 1 capsule (60 mg total) by mouth daily. 06/14/17  Yes Glean Hess, MD  HYDROcodone-acetaminophen (NORCO) 5-325 MG tablet Take 1 tablet by mouth every 6 (six) hours as needed for severe pain. 07/29/17  Yes Darel Hong, MD  irbesartan (AVAPRO) 150 MG tablet TAKE 1 TABLET (150 MG TOTAL) BY MOUTH DAILY. 06/10/17  Yes Glean Hess, MD  promethazine (PHENERGAN) 25 MG tablet Take 1 tablet (25 mg total) by mouth every 6 (six) hours as needed for nausea or vomiting. 09/26/17  Yes [name], MD  spironolactone (ALDACTONE) 50 MG tablet TAKE 1 TABLET (50 MG TOTAL) BY MOUTH DAILY. 09/10/17  Yes Glean Hess, MD  topiramate (TOPAMAX) 25 MG tablet TAKE 2 TABLETS (50 MG TOTAL) BY MOUTH AT BEDTIME. 06/10/17  Yes Glean Hess, MD  budesonide (ENTOCORT EC) 3 MG 24 hr capsule Take 3 capsules (9 mg total) by mouth every morning. Patient not taking: Reported on 10/02/2017 09/26/17   [name], MD    Allergies as of 09/26/2017 - Review Complete 09/26/2017  Allergen Reaction Noted  . Levofloxacin Shortness Of Breath 03/23/2015  . Ace inhibitors Cough 03/23/2015  . Prednisone Itching 04/04/2016    Family History  Problem Relation Age of Onset  . Breast cancer Mother 71  . Cancer Mother   . Diabetes Father   . Osteoarthritis Father   . Breast cancer Maternal Grandmother 90    Social History   Socioeconomic History  . Marital status: Married    Spouse  name: Not on file  . Number of children: Not on file  . Years of education: Not on file  . Highest education level: Not on file  Social Needs  . Financial resource strain: Not on file  . Food insecurity - worry: Not on file  . Food insecurity - inability: Not on file  . Transportation needs - medical: Not on file  . Transportation needs - non-medical: Not on file  Occupational History  . Occupation: Paralegal  Tobacco Use  . Smoking status: Current Every Day Smoker    Packs/day: 0.50    Years: 20.00    Pack years: 10.00    Types: Cigarettes  . Smokeless tobacco: Never Used  Substance and Sexual Activity  . Alcohol use: Yes    Alcohol/week: 0.0 oz    Comment: occasionally - several times/year  . Drug use: No  . Sexual activity: Not on file  Other Topics Concern  . Not on file  Social History Narrative  . Not on file    Review of Systems: See HPI, otherwise negative ROS  Physical Exam: BP 125/72   Pulse 80   Temp (!) 97.5 F (36.4 C) (Temporal)   Resp 16   Ht 5\' 5"  (1.651 m)   Wt 184 lb (83.5 kg)   SpO2 100%   BMI 30.62 kg/m  General:   Alert,  pleasant and cooperative in NAD Head:  Normocephalic and atraumatic. Neck:  Supple; no masses or thyromegaly. Lungs:  Clear throughout to auscultation.    Heart:  Regular rate and rhythm. Abdomen:  Soft, nontender and nondistended. Normal bowel sounds, without guarding, and without rebound.   Neurologic:  Alert and  oriented x4;  grossly normal neurologically.  Impression/Plan: Jacqueline Harmon is here for an endoscopy to be performed for gastric intestinal metaplasia  Risks, benefits, limitations, and alternatives regarding  endoscopy have been reviewed with the patient.  Questions have been answered.  All parties agreeable.   [name], MD  10/04/2017, 10:03 AM

## 2017-10-09 ENCOUNTER — Encounter: Payer: Self-pay | Admitting: Gastroenterology

## 2017-10-10 ENCOUNTER — Telehealth: Payer: Self-pay

## 2017-10-10 NOTE — Telephone Encounter (Signed)
-----   Message from [name], MD sent at 10/10/2017  2:06 PM EST ----- The patient know that her biopsies of her stomach did not show any cancerous lesions. She should have a repeat upper endoscopy in 2 years.

## 2017-10-10 NOTE — Telephone Encounter (Signed)
Left vm on pt's cell number regarding EGD results. Pt added to recall list.

## 2017-10-23 ENCOUNTER — Other Ambulatory Visit: Payer: Self-pay

## 2017-11-13 ENCOUNTER — Other Ambulatory Visit: Payer: Self-pay

## 2017-11-13 ENCOUNTER — Ambulatory Visit
Admission: EM | Admit: 2017-11-13 | Discharge: 2017-11-13 | Disposition: A | Discharge status: Home / Self Care | Payer: 59 | Attending: Family Medicine | Admitting: Family Medicine

## 2017-11-13 ENCOUNTER — Encounter: Payer: Self-pay | Admitting: Emergency Medicine

## 2017-11-13 DIAGNOSIS — M5432 Sciatica, left side: Secondary | ICD-10-CM | POA: Diagnosis not present

## 2017-11-13 MED ORDER — KETOROLAC TROMETHAMINE 60 MG/2ML IM SOLN
30.0000 mg | Freq: Once | INTRAMUSCULAR | Status: AC
  Administered 2017-11-13: 30 mg via INTRAMUSCULAR

## 2017-11-13 MED ORDER — HYDROCODONE-ACETAMINOPHEN 5-325 MG PO TABS: 1.0000 | ORAL_TABLET | Freq: Four times a day (QID) | ORAL | 0 refills | Status: DC | PRN

## 2017-11-13 NOTE — ED Provider Notes (Addendum)
[facility]    CSN: [id number] Arrival date & time: 11/13/17  0856     History   Chief Complaint Chief Complaint  Patient presents with  . Back Pain    HPI Jacqueline Harmon is a 62 y.o. female.    Back Pain  Location:  Lumbar spine Quality:  Shooting Radiates to:  L posterior upper leg Pain severity:  Severe Pain is:  Same all the time Onset quality:  Sudden Duration:  1 week Timing:  Constant Progression:  Unchanged Chronicity:  New Context: not emotional stress, not falling, not jumping from heights, not lifting heavy objects, not MCA, not MVA, not occupational injury, not pedestrian accident, not physical stress, not recent illness, not recent injury and not twisting   Relieved by:  Nothing Worsened by:  Movement and bending Ineffective treatments:  Narcotics and muscle relaxants Associated symptoms: leg pain   Associated symptoms: no abdominal pain, no abdominal swelling, no bladder incontinence, no bowel incontinence, no chest pain, no dysuria, no fever, no headaches, no numbness, no paresthesias, no pelvic pain, no perianal numbness, no tingling, no weakness and no weight loss     Past Medical History:  Diagnosis Date  . Headache    migraines - 1x/mo - better since topamax  . Hypercholesteremia   . Hypertension   . Multilevel degenerative disc disease   . Wears dentures    partial upper and lower    Patient Active Problem List   Diagnosis Date Noted  . Atrophic gastritis without hemorrhage   . Anemia   . Abdominal pain, generalized   . Benign neoplasm of ascending colon   . Gastroesophageal reflux disease with esophagitis 06/14/2017  . Intestinal metaplasia of gastric mucosa 02/11/2017  . Gastritis without bleeding   . Adenomatous colon polyp   . Mixed hyperlipidemia 06/10/2015  . Smokes tobacco daily 03/23/2015  . Essential (primary) hypertension 03/23/2015  . Intraductal papilloma of breast, left 03/23/2015  . Hot flash, menopausal  03/23/2015  . Migraine without aura and responsive to treatment 03/23/2015  . 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 ANTERIOR CERVICAL DISCECTOMY  2010  . BREAST BIOPSY Left 08/2014   papilloma  . COLONOSCOPY WITH PROPOFOL N/A 08/02/2016   Procedure: COLONOSCOPY WITH PROPOFOL;  Surgeon: [name], MD;  Location: [place];  Service: Endoscopy;  Laterality: N/A;  . COLONOSCOPY WITH PROPOFOL N/A 09/13/2017   Procedure: COLONOSCOPY WITH PROPOFOL;  Surgeon: [name], MD;  Location: [place];  Service: Endoscopy;  Laterality: N/A;  . ENDOSCOPIC HEMILAMINOTOMY W/ DISCECTOMY LUMBAR  2009, 2011  . ESOPHAGEAL DILATION  08/02/2016   Procedure: ESOPHAGEAL DILATION;  Surgeon: [name], MD;  Location: [place];  Service: Endoscopy;;  . ESOPHAGOGASTRODUODENOSCOPY (EGD) WITH PROPOFOL N/A 08/02/2016   Repeat EGD 08/2019. Performed by Dr. [name]  . ESOPHAGOGASTRODUODENOSCOPY (EGD) WITH PROPOFOL N/A 09/13/2017   Procedure: ESOPHAGOGASTRODUODENOSCOPY (EGD) WITH PROPOFOL;  Surgeon: [name], MD;  Location: [place];  Service: Endoscopy;  Laterality: N/A;  . ESOPHAGOGASTRODUODENOSCOPY (EGD) WITH PROPOFOL N/A 10/04/2017   Procedure: ESOPHAGOGASTRODUODENOSCOPY (EGD) WITH PROPOFOL;  Surgeon: [name], MD;  Location: [place];  Service: Endoscopy;  Laterality: N/A;  . POLYPECTOMY  09/13/2017   Procedure: POLYPECTOMY;  Surgeon: [name], MD;  Location: [place];  Service: Endoscopy;;    OB History    No data available       Home Medications    Prior to  Admission medications   Medication Sig Start Date End Date Taking? Authorizing Provider  albuterol (PROVENTIL HFA;VENTOLIN HFA) 108 (90 Base) MCG/ACT inhaler Inhale 2 puffs into the lungs every 6 (six) hours as needed for wheezing or shortness of breath. 10/12/16  Yes Glean Hess, MD  atorvastatin (LIPITOR) 10 MG tablet TAKE  1 TABLET BY MOUTH EVERY DAY 09/10/17  Yes Glean Hess, MD  budesonide (ENTOCORT EC) 3 MG 24 hr capsule Take 3 capsules (9 mg total) by mouth every morning. 09/26/17  Yes [name], MD  buPROPion (WELLBUTRIN SR) 150 MG 12 hr tablet Take 1 tablet (150 mg total) by mouth 2 (two) times daily. 09/18/17  Yes Glean Hess, MD  Butalbital-APAP-Caffeine 50-300-40 MG CAPS Take 1 capsule by mouth 2 (two) times daily as needed. migraine 03/23/15  Yes Glean Hess, MD  dexlansoprazole (DEXILANT) 60 MG capsule Take 1 capsule (60 mg total) by mouth daily. 06/14/17  Yes Glean Hess, MD  irbesartan (AVAPRO) 150 MG tablet TAKE 1 TABLET (150 MG TOTAL) BY MOUTH DAILY. 06/10/17  Yes Glean Hess, MD  mesalamine (APRISO) 0.375 g 24 hr capsule Take 4 capsules (1.5 g total) by mouth daily. 10/04/17  Yes [name], MD  promethazine (PHENERGAN) 25 MG tablet Take 1 tablet (25 mg total) by mouth every 6 (six) hours as needed for nausea or vomiting. 09/26/17  Yes [name], MD  spironolactone (ALDACTONE) 50 MG tablet TAKE 1 TABLET (50 MG TOTAL) BY MOUTH DAILY. 09/10/17  Yes Glean Hess, MD  topiramate (TOPAMAX) 25 MG tablet TAKE 2 TABLETS (50 MG TOTAL) BY MOUTH AT BEDTIME. 06/10/17  Yes Glean Hess, MD  HYDROcodone-acetaminophen (NORCO/VICODIN) 5-325 MG tablet Take 1-2 tablets by mouth every 6 (six) hours as needed. 11/13/17   Norval Gable, MD    Family History Family History  Problem Relation Age of Onset  . Breast cancer Mother 91  . Cancer Mother   . Diabetes Father   . Osteoarthritis Father   . Breast cancer Maternal Grandmother 90    Social History Social History   Tobacco Use  . Smoking status: Current Every Day Smoker    Packs/day: 0.50    Years: 20.00    Pack years: 10.00    Types: Cigarettes  . Smokeless tobacco: Never Used  Substance Use Topics  . Alcohol use: Yes    Alcohol/week: 0.0 oz    Comment: occasionally - several times/year  . Drug use: No      Allergies   Levofloxacin; Ace inhibitors; Budesonide; and Prednisone   Review of Systems Review of Systems  Constitutional: Negative for fever and weight loss.  Cardiovascular: Negative for chest pain.  Gastrointestinal: Negative for abdominal pain and bowel incontinence.  Genitourinary: Negative for bladder incontinence, dysuria and pelvic pain.  Musculoskeletal: Positive for back pain.  Neurological: Negative for tingling, weakness, numbness, headaches and paresthesias.     Physical Exam Triage Vital Signs ED Triage Vitals  Enc Vitals Group     BP 11/13/17 0916 (!) 164/70     Pulse Rate 11/13/17 0916 87     Resp --      Temp 11/13/17 0916 98 F (36.7 C)     Temp Source 11/13/17 0916 Oral     SpO2 11/13/17 0916 100 %     Weight 11/13/17 0912 180 lb (81.6 kg)     Height 11/13/17 0912 5\' 5"  (1.651 m)     Head Circumference --  Peak Flow --      Pain Score 11/13/17 0912 8     Pain Loc --      Pain Edu? --      Excl. in [place]? --    No data found.  Updated Vital Signs BP (!) 164/70 (BP Location: Left Arm)   Pulse 87   Temp 98 F (36.7 C) (Oral)   Ht 5\' 5"  (1.651 m)   Wt 180 lb (81.6 kg)   SpO2 100%   BMI 29.95 kg/m   Visual Acuity Right Eye Distance:   Left Eye Distance:   Bilateral Distance:    Right Eye Near:   Left Eye Near:    Bilateral Near:     Physical Exam  Constitutional: She appears well-developed and well-nourished. No distress.  Musculoskeletal: She exhibits tenderness. She exhibits no edema.       Lumbar back: She exhibits tenderness (to left lumbar paraspinous muscles and buttock) and spasm. She exhibits normal range of motion, no bony tenderness, no swelling, no edema, no deformity, no laceration, no pain and normal pulse.  Neurological: She is alert. She has normal reflexes. She displays normal reflexes. She exhibits normal muscle tone.  Skin: Skin is warm and dry. No rash noted. She is not diaphoretic. No erythema.  Nursing note  and vitals reviewed.    UC Treatments / Results  Labs (all labs ordered are listed, but only abnormal results are displayed) Labs Reviewed - No data to display  EKG  EKG Interpretation None       Radiology No results found.  Procedures Procedures (including critical care time)  Medications Ordered in UC Medications  ketorolac (TORADOL) injection 30 mg (30 mg Intramuscular Given 11/13/17 1035)     Initial Impression / Assessment and Plan / UC Course  I have reviewed the triage vital signs and the nursing notes.  Pertinent labs & imaging results that were available during my care of the patient were reviewed by me and considered in my medical decision making (see chart for details).       Final Clinical Impressions(s) / UC Diagnoses   Final diagnoses:  Sciatica of left side    ED Discharge Orders        Ordered    HYDROcodone-acetaminophen (NORCO/VICODIN) 5-325 MG tablet  Every 6 hours PRN     11/13/17 1034     1. diagnosis reviewed with patient 2. Given toradol 30mg  IM x 1 with improvement of symptoms 3. rx as per orders above; reviewed possible side effects, interactions, risks and benefits  4. Recommend supportive treatment with rest, ice/heat 5. Follow-up prn if symptoms worsen or don't improve  Controlled Substance Prescriptions [place] Controlled Substance Registry consulted? Not Applicable   Norval Gable, MD 11/13/17 [place], MD 11/13/17 1113

## 2017-11-13 NOTE — ED Triage Notes (Signed)
Patient states that she has taken Hydrocodone and Flexeril around 4am this morning and it has not helped with her pain.

## 2017-11-13 NOTE — ED Triage Notes (Signed)
Patient c/o left lower back pain and muscle spasm that started a week.  Patient denies injury or fall.  Patient reports history of back problems.

## 2017-11-14 ENCOUNTER — Telehealth: Payer: Self-pay

## 2017-11-14 ENCOUNTER — Other Ambulatory Visit: Payer: Self-pay

## 2017-11-14 MED ORDER — TOPIRAMATE 25 MG PO TABS: ORAL_TABLET | ORAL | 0 refills | Status: DC

## 2017-11-14 NOTE — Telephone Encounter (Signed)
Please call patient and have her schedule follow up in April for HTN, and then physical on or after 06/14/2018.   Thank you.

## 2017-11-15 ENCOUNTER — Telehealth: Payer: Self-pay

## 2017-11-15 NOTE — Telephone Encounter (Signed)
Patient called stating she was last seen at UC recently and they told her she has decreased kidney function. She stated she was unaware.  Reviewed the patient's chart with Dr Army Melia. Called her back and informed her the last lab we did here in our office was normal. The decrease in kidney function started 3 months ago at the hospital. Informed her with high blood pressure her kidney function over time can decrease. Told her to drink plenty of fluids everyday to make sure she is not dehydrated and this should help. Kidney function is not low enough to be concerned at this time. Just drink plenty of fluids. She verbalized understanding and will do this. Will recheck at next visit.

## 2017-11-15 NOTE — Telephone Encounter (Signed)
Left a message

## 2018-01-13 ENCOUNTER — Encounter: Payer: Self-pay | Admitting: Internal Medicine

## 2018-01-13 ENCOUNTER — Ambulatory Visit: Payer: 59 | Admitting: Internal Medicine

## 2018-01-13 VITALS — BP 150/80 | HR 86 | Ht 65.0 in | Wt 187.0 lb

## 2018-01-13 DIAGNOSIS — K2100 Gastro-esophageal reflux disease with esophagitis, without bleeding: Secondary | ICD-10-CM

## 2018-01-13 DIAGNOSIS — G4730 Sleep apnea, unspecified: Secondary | ICD-10-CM | POA: Diagnosis not present

## 2018-01-13 DIAGNOSIS — Z720 Tobacco use: Secondary | ICD-10-CM | POA: Diagnosis not present

## 2018-01-13 DIAGNOSIS — F17200 Nicotine dependence, unspecified, uncomplicated: Secondary | ICD-10-CM

## 2018-01-13 DIAGNOSIS — I10 Essential (primary) hypertension: Secondary | ICD-10-CM

## 2018-01-13 NOTE — Patient Instructions (Signed)
Stop Bupropion to see if any symptoms improve.

## 2018-01-13 NOTE — Progress Notes (Signed)
Date:  01/13/2018   Name:  Jacqueline Harmon   DOB:  [birth year]   MRN:  [id number]   Chief Complaint: Hypertension Hypertension  This is a chronic problem. The problem is controlled. Associated symptoms include headaches and shortness of breath. Pertinent negatives include no chest pain or palpitations. Past treatments include angiotensin blockers. The current treatment provides significant improvement.  Gastroesophageal Reflux  She complains of coughing and heartburn. She reports no abdominal pain, no chest pain or no wheezing. This is a recurrent problem. Associated symptoms include fatigue. She has tried a PPI for the symptoms.  Nicotine Dependence  Presents for follow-up visit. Symptoms include fatigue. Her urge triggers include company of smokers. She smokes < 1/2 a pack of cigarettes per day.  Shortness of breath - she has noticed this over the past few months when she has any kind of exertion.  No wheezing or cough.  No chest pain.  She started bupropion and dexilant around that same time. She continues to smoke but has cut back some.  Snoring/apnea - her friend says that she snores and he has witnessed her stop breathing then gasp during sleep.  She admits to fatigue, headache, elevated BP despite medication.  Epwork scale 15/24/.  Lab Results  Component Value Date   CREATININE 1.30 (H) 09/11/2017   BUN 13 07/29/2017   NA 140 07/29/2017   K 4.7 07/29/2017   CL 108 07/29/2017   CO2 26 07/29/2017      Review of Systems  Constitutional: Positive for fatigue. Negative for chills and fever.  HENT: Negative for hearing loss and trouble swallowing.   Respiratory: Positive for cough and shortness of breath. Negative for chest tightness and wheezing.   Cardiovascular: Negative for chest pain and palpitations.  Gastrointestinal: Positive for heartburn. Negative for abdominal pain, constipation, diarrhea and vomiting.  Musculoskeletal: Negative for arthralgias, joint swelling and  myalgias.  Neurological: Positive for weakness and headaches. Negative for dizziness and tremors.  Psychiatric/Behavioral: Positive for sleep disturbance. The patient is nervous/anxious.     Patient Active Problem List   Diagnosis Date Noted  . Atrophic gastritis without hemorrhage   . Anemia   . Abdominal pain, generalized   . Benign neoplasm of ascending colon   . Gastroesophageal reflux disease with esophagitis 06/14/2017  . Intestinal metaplasia of gastric mucosa 02/11/2017  . Gastritis without bleeding   . Adenomatous colon polyp   . Mixed hyperlipidemia 06/10/2015  . Smokes tobacco daily 03/23/2015  . Essential (primary) hypertension 03/23/2015  . Intraductal papilloma of breast, left 03/23/2015  . Hot flash, menopausal 03/23/2015  . Migraine without aura and responsive to treatment 03/23/2015  . 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 Authorizing Provider  albuterol (PROVENTIL HFA;VENTOLIN HFA) 108 (90 Base) MCG/ACT inhaler Inhale 2 puffs into the lungs every 6 (six) hours as needed for wheezing or shortness of breath. 10/12/16   Glean Hess, MD  atorvastatin (LIPITOR) 10 MG tablet TAKE 1 TABLET BY MOUTH EVERY DAY 09/10/17   Glean Hess, MD  budesonide (ENTOCORT EC) 3 MG 24 hr capsule Take 3 capsules (9 mg total) by mouth every morning. 09/26/17   [name], MD  buPROPion (WELLBUTRIN SR) 150 MG 12 hr tablet Take 1 tablet (150 mg total) by mouth 2 (two) times daily. 09/18/17   Glean Hess, MD  Butalbital-APAP-Caffeine 50-300-40 MG CAPS Take 1 capsule by mouth 2 (two) times daily  as needed. migraine 03/23/15   Glean Hess, MD  dexlansoprazole (DEXILANT) 60 MG capsule Take 1 capsule (60 mg total) by mouth daily. 06/14/17   Glean Hess, MD  HYDROcodone-acetaminophen (NORCO/VICODIN) 5-325 MG tablet Take 1-2 tablets by mouth every 6 (six) hours as needed. 11/13/17   Norval Gable, MD  irbesartan  (AVAPRO) 150 MG tablet TAKE 1 TABLET (150 MG TOTAL) BY MOUTH DAILY. 06/10/17   Glean Hess, MD  mesalamine (APRISO) 0.375 g 24 hr capsule Take 4 capsules (1.5 g total) by mouth daily. 10/04/17   [name], MD  promethazine (PHENERGAN) 25 MG tablet Take 1 tablet (25 mg total) by mouth every 6 (six) hours as needed for nausea or vomiting. 09/26/17   [name], MD  spironolactone (ALDACTONE) 50 MG tablet TAKE 1 TABLET (50 MG TOTAL) BY MOUTH DAILY. 09/10/17   Glean Hess, MD  topiramate (TOPAMAX) 25 MG tablet TAKE 2 TABLETS (50 MG TOTAL) BY MOUTH AT BEDTIME. 11/14/17   Glean Hess, MD    Allergies  Allergen Reactions  . Levofloxacin Shortness Of Breath    myalgia, constipation  . Ace Inhibitors Cough    One particular medication causes problems - maybe starts with letter L  . Budesonide Nausea And Vomiting  . Prednisone Itching    Past Surgical History:  Procedure Laterality Date  . ANTERIOR CERVICAL DISCECTOMY  2010  . BREAST BIOPSY Left 08/2014   papilloma  . COLONOSCOPY WITH PROPOFOL N/A 08/02/2016   Procedure: COLONOSCOPY WITH PROPOFOL;  Surgeon: [name], MD;  Location: [place];  Service: Endoscopy;  Laterality: N/A;  . COLONOSCOPY WITH PROPOFOL N/A 09/13/2017   Procedure: COLONOSCOPY WITH PROPOFOL;  Surgeon: [name], MD;  Location: [place];  Service: Endoscopy;  Laterality: N/A;  . ENDOSCOPIC HEMILAMINOTOMY W/ DISCECTOMY LUMBAR  2009, 2011  . ESOPHAGEAL DILATION  08/02/2016   Procedure: ESOPHAGEAL DILATION;  Surgeon: [name], MD;  Location: [place];  Service: Endoscopy;;  . ESOPHAGOGASTRODUODENOSCOPY (EGD) WITH PROPOFOL N/A 08/02/2016   Repeat EGD 08/2019. Performed by Dr. [name]  . ESOPHAGOGASTRODUODENOSCOPY (EGD) WITH PROPOFOL N/A 09/13/2017   Procedure: ESOPHAGOGASTRODUODENOSCOPY (EGD) WITH PROPOFOL;  Surgeon: [name], MD;  Location: [place];  Service: Endoscopy;  Laterality: N/A;  .  ESOPHAGOGASTRODUODENOSCOPY (EGD) WITH PROPOFOL N/A 10/04/2017   Procedure: ESOPHAGOGASTRODUODENOSCOPY (EGD) WITH PROPOFOL;  Surgeon: [name], MD;  Location: [place];  Service: Endoscopy;  Laterality: N/A;  . POLYPECTOMY  09/13/2017   Procedure: POLYPECTOMY;  Surgeon: [name], MD;  Location: [place];  Service: Endoscopy;;    Social History   Tobacco Use  . Smoking status: Current Every Day Smoker    Packs/day: 0.50    Years: 20.00    Pack years: 10.00    Types: Cigarettes  . Smokeless tobacco: Never Used  Substance Use Topics  . Alcohol use: Yes    Alcohol/week: 0.0 oz    Comment: occasionally - several times/year  . Drug use: No     Medication list has been reviewed and updated.  PHQ 2/9 Scores 06/14/2017 06/12/2016  PHQ - 2 Score 0 0    Physical Exam  Constitutional: She is oriented to person, place, and time. She appears well-developed. She has a sickly appearance. No distress.  HENT:  Head: Normocephalic and atraumatic.  Neck: Normal range of motion. Carotid bruit is not present. No thyromegaly present.  Cardiovascular: Normal rate and regular rhythm.  Pulmonary/Chest: Effort normal and breath sounds  normal. No respiratory distress. She has no decreased breath sounds. She has no wheezes. She has no rhonchi.  Abdominal: Soft. Normal appearance and bowel sounds are normal. There is no tenderness. There is no rebound and no guarding.  Musculoskeletal: Normal range of motion.  Neurological: She is alert and oriented to person, place, and time. She has normal strength and normal reflexes. Gait normal.  Skin: Skin is warm and dry. No rash noted.  Psychiatric: Her speech is normal and behavior is normal. Thought content normal. Cognition and memory are normal. She exhibits a depressed mood.  Nursing note and vitals reviewed.   BP (!) 150/80   Pulse 86   Ht 5\' 5"  (1.651 m)   Wt 187 lb (84.8 kg)   SpO2 99%   BMI 31.12 kg/m   Assessment and  Plan: 1. Essential (primary) hypertension Fair control - Comprehensive metabolic panel  2. Smokes tobacco daily Continue to reduce usage Hold bupropion to see if myalgias and/or SOB improve  3. Gastroesophageal reflux disease with esophagitis Continue PPI - CBC with Differential/Platelet  4. Sleep disorder breathing Needs sleep study - Nocturnal polysomnography (NPSG); Future   No orders of the defined types were placed in this encounter.   Partially dictated using Editor, commissioning. Any errors are unintentional.  Halina Maidens, MD [facility]  01/13/2018

## 2018-01-14 LAB — COMPREHENSIVE METABOLIC PANEL
A/G RATIO: 1.4 (ref 1.2–2.2)
ALK PHOS: 106 IU/L (ref 39–117)
ALT: 17 IU/L (ref 0–32)
AST: 23 IU/L (ref 0–40)
Albumin: 4.1 g/dL (ref 3.6–4.8)
BUN / CREAT RATIO: 11 — AB (ref 12–28)
BUN: 12 mg/dL (ref 8–27)
CHLORIDE: 109 mmol/L — AB (ref 96–106)
CO2: 20 mmol/L (ref 20–29)
Calcium: 9.4 mg/dL (ref 8.7–10.3)
Creatinine, Ser: 1.12 mg/dL — ABNORMAL HIGH (ref 0.57–1.00)
GFR calc non Af Amer: 54 mL/min/{1.73_m2} — ABNORMAL LOW (ref >59)
GFR, EST AFRICAN AMERICAN: 62 mL/min/{1.73_m2} (ref >59)
GLUCOSE: 71 mg/dL (ref 65–99)
Globulin, Total: 3 g/dL (ref 1.5–4.5)
POTASSIUM: 4.9 mmol/L (ref 3.5–5.2)
Sodium: 141 mmol/L (ref 134–144)
TOTAL PROTEIN: 7.1 g/dL (ref 6.0–8.5)

## 2018-01-14 LAB — CBC WITH DIFFERENTIAL/PLATELET
BASOS: 1 %
Basophils Absolute: 0.1 10*3/uL (ref 0.0–0.2)
EOS (ABSOLUTE): 0.3 10*3/uL (ref 0.0–0.4)
Eos: 2 %
Hematocrit: 30.2 % — ABNORMAL LOW (ref 34.0–46.6)
Hemoglobin: 8.9 g/dL — ABNORMAL LOW (ref 11.1–15.9)
Immature Grans (Abs): 0 10*3/uL (ref 0.0–0.1)
Immature Granulocytes: 0 %
LYMPHS ABS: 3.9 10*3/uL — AB (ref 0.7–3.1)
Lymphs: 32 %
MCH: 20.3 pg — AB (ref 26.6–33.0)
MCHC: 29.5 g/dL — AB (ref 31.5–35.7)
MCV: 69 fL — AB (ref 79–97)
Monocytes Absolute: 1.2 10*3/uL — ABNORMAL HIGH (ref 0.1–0.9)
Monocytes: 10 %
NEUTROS ABS: 6.7 10*3/uL (ref 1.4–7.0)
Neutrophils: 55 %
Platelets: 582 10*3/uL — ABNORMAL HIGH (ref 150–379)
RBC: 4.38 x10E6/uL (ref 3.77–5.28)
RDW: 19.7 % — ABNORMAL HIGH (ref 12.3–15.4)
WBC: 12.2 10*3/uL — ABNORMAL HIGH (ref 3.4–10.8)

## 2018-01-17 ENCOUNTER — Encounter: Payer: Self-pay | Admitting: Internal Medicine

## 2018-01-17 LAB — SPECIMEN STATUS REPORT

## 2018-01-17 LAB — IRON AND TIBC
Iron Saturation: 4 % — CL (ref 15–55)
Iron: 18 ug/dL — ABNORMAL LOW (ref 27–159)
TIBC: 481 ug/dL — AB (ref 250–450)
UIBC: 463 ug/dL — AB (ref 131–425)

## 2018-01-17 LAB — FERRITIN: Ferritin: 8 ng/mL — ABNORMAL LOW (ref 15–150)

## 2018-02-16 ENCOUNTER — Other Ambulatory Visit: Payer: Self-pay | Admitting: Internal Medicine

## 2018-05-05 ENCOUNTER — Ambulatory Visit: Payer: 59 | Admitting: Internal Medicine

## 2018-05-05 ENCOUNTER — Ambulatory Visit
Admission: RE | Admit: 2018-05-05 | Discharge: 2018-05-05 | Disposition: A | Discharge status: Home / Self Care | Payer: 59 | Source: Ambulatory Visit | Attending: Internal Medicine | Admitting: Internal Medicine

## 2018-05-05 ENCOUNTER — Other Ambulatory Visit
Admission: RE | Admit: 2018-05-05 | Discharge: 2018-05-05 | Disposition: A | Discharge status: Home / Self Care | Payer: 59 | Source: Ambulatory Visit | Attending: Internal Medicine | Admitting: Internal Medicine

## 2018-05-05 ENCOUNTER — Encounter: Payer: Self-pay | Admitting: Internal Medicine

## 2018-05-05 VITALS — BP 148/80 | HR 83 | Ht 65.0 in | Wt 173.0 lb

## 2018-05-05 DIAGNOSIS — D509 Iron deficiency anemia, unspecified: Secondary | ICD-10-CM

## 2018-05-05 DIAGNOSIS — R5383 Other fatigue: Secondary | ICD-10-CM

## 2018-05-05 DIAGNOSIS — K2100 Gastro-esophageal reflux disease with esophagitis, without bleeding: Secondary | ICD-10-CM

## 2018-05-05 DIAGNOSIS — R0602 Shortness of breath: Secondary | ICD-10-CM | POA: Diagnosis present

## 2018-05-05 DIAGNOSIS — F17200 Nicotine dependence, unspecified, uncomplicated: Secondary | ICD-10-CM | POA: Insufficient documentation

## 2018-05-05 LAB — CBC WITH DIFFERENTIAL/PLATELET
Basophils Absolute: 0.1 10*3/uL (ref 0–0.1)
Basophils Relative: 1 %
EOS ABS: 0.1 10*3/uL (ref 0–0.7)
EOS PCT: 1 %
HCT: 31.1 % — ABNORMAL LOW (ref 35.0–47.0)
Hemoglobin: 9.7 g/dL — ABNORMAL LOW (ref 12.0–16.0)
Lymphocytes Relative: 43 %
Lymphs Abs: 4.2 10*3/uL — ABNORMAL HIGH (ref 1.0–3.6)
MCH: 21.4 pg — AB (ref 26.0–34.0)
MCHC: 31.2 g/dL — AB (ref 32.0–36.0)
MCV: 68.6 fL — ABNORMAL LOW (ref 80.0–100.0)
MONOS PCT: 7 %
Monocytes Absolute: 0.7 10*3/uL (ref 0.2–0.9)
Neutro Abs: 4.7 10*3/uL (ref 1.4–6.5)
Neutrophils Relative %: 48 %
PLATELETS: 583 10*3/uL — AB (ref 150–440)
RBC: 4.52 MIL/uL (ref 3.80–5.20)
RDW: 19.8 % — AB (ref 11.5–14.5)
WBC: 9.9 10*3/uL (ref 3.6–11.0)

## 2018-05-05 LAB — COMPREHENSIVE METABOLIC PANEL
ALT: 13 U/L (ref 0–44)
ANION GAP: 9 (ref 5–15)
AST: 19 U/L (ref 15–41)
Albumin: 4.2 g/dL (ref 3.5–5.0)
Alkaline Phosphatase: 81 U/L (ref 38–126)
BUN: 13 mg/dL (ref 6–20)
CALCIUM: 9.1 mg/dL (ref 8.9–10.3)
CHLORIDE: 107 mmol/L (ref 98–111)
CO2: 22 mmol/L (ref 22–32)
Creatinine, Ser: 1.17 mg/dL — ABNORMAL HIGH (ref 0.44–1.00)
GFR calc non Af Amer: 50 mL/min — ABNORMAL LOW (ref >60)
GFR, EST AFRICAN AMERICAN: 57 mL/min — AB (ref >60)
Glucose, Bld: 75 mg/dL (ref 70–99)
POTASSIUM: 3.9 mmol/L (ref 3.5–5.1)
SODIUM: 138 mmol/L (ref 135–145)
Total Bilirubin: 0.5 mg/dL (ref 0.3–1.2)
Total Protein: 7.7 g/dL (ref 6.5–8.1)

## 2018-05-05 LAB — HEMOGLOBIN A1C
Hgb A1c MFr Bld: 5.8 % — ABNORMAL HIGH (ref 4.8–5.6)
MEAN PLASMA GLUCOSE: 119.76 mg/dL

## 2018-05-05 LAB — TSH: TSH: 1.302 u[IU]/mL (ref 0.350–4.500)

## 2018-05-05 NOTE — Progress Notes (Signed)
Date:  05/05/2018   Name:  Jacqueline Harmon   DOB:  Oct 16, 1955   MRN:  [id number]   Chief Complaint: Fatigue (Loss of appetite, and fatigue. Has lost weight since last visit in April. Patient stated fatigue has gotten worse. Feels like could pass out in last few days she was so weak and tired. ); Hypertension; and Hyperlipidemia (Needs refill on meds.) Hypertension  This is a chronic problem. The problem is controlled. Associated symptoms include shortness of breath. Pertinent negatives include no chest pain, headaches or palpitations.  Hyperlipidemia  The problem is controlled. Associated symptoms include shortness of breath. Pertinent negatives include no chest pain. Current antihyperlipidemic treatment includes statins. The current treatment provides significant improvement of lipids.  Anemia - she tried iron supplement for 2 months but made her constipated and nauseated.  She did not return for recheck.  She has hx of gastritis but other than decreased appetite, no GI sx.  She has seen no change in bowel habits.    Review of Systems  Constitutional: Positive for fatigue. Negative for chills, diaphoresis and fever.  Respiratory: Positive for shortness of breath. Negative for cough, chest tightness and wheezing.   Cardiovascular: Negative for chest pain and palpitations.  Gastrointestinal: Negative for abdominal pain, blood in stool, constipation and diarrhea.  Genitourinary: Negative for dysuria.  Skin: Negative for rash.  Neurological: Positive for light-headedness. Negative for dizziness, tremors, syncope and headaches.  Psychiatric/Behavioral: Negative for dysphoric mood and sleep disturbance. The patient is not nervous/anxious.     Patient Active Problem List   Diagnosis Date Noted  . Atrophic gastritis without hemorrhage   . Iron deficiency anemia   . Abdominal pain, generalized   . Benign neoplasm of ascending colon   . Gastroesophageal reflux disease with esophagitis  06/14/2017  . Intestinal metaplasia of gastric mucosa 02/11/2017  . Gastritis without bleeding   . Adenomatous colon polyp   . Mixed hyperlipidemia 06/10/2015  . Smokes tobacco daily 03/23/2015  . Essential (primary) hypertension 03/23/2015  . Intraductal papilloma of breast, left 03/23/2015  . Hot flash, menopausal 03/23/2015  . Migraine without aura and responsive to treatment 03/23/2015  . 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 Authorizing Provider  albuterol (PROVENTIL HFA;VENTOLIN HFA) 108 (90 Base) MCG/ACT inhaler Inhale 2 puffs into the lungs every 6 (six) hours as needed for wheezing or shortness of breath. 10/12/16  Yes Glean Hess, MD  atorvastatin (LIPITOR) 10 MG tablet TAKE 1 TABLET BY MOUTH EVERY DAY 09/10/17  Yes Glean Hess, MD  buPROPion [facility] SR) 150 MG 12 hr tablet Take 1 tablet (150 mg total) by mouth 2 (two) times daily. 09/18/17  Yes Glean Hess, MD  Butalbital-APAP-Caffeine 50-300-40 MG CAPS Take 1 capsule by mouth 2 (two) times daily as needed. migraine 03/23/15  Yes Glean Hess, MD  dexlansoprazole (DEXILANT) 60 MG capsule Take 1 capsule (60 mg total) by mouth daily. 06/14/17  Yes Glean Hess, MD  HYDROcodone-acetaminophen (NORCO/VICODIN) 5-325 MG tablet Take 1-2 tablets by mouth every 6 (six) hours as needed. 11/13/17  Yes Conty, Linward Foster, MD  irbesartan (AVAPRO) 150 MG tablet TAKE 1 TABLET (150 MG TOTAL) BY MOUTH DAILY. 06/10/17  Yes Glean Hess, MD  promethazine (PHENERGAN) 25 MG tablet Take 1 tablet (25 mg total) by mouth every 6 (six) hours as needed for nausea or vomiting. 09/26/17  Yes [name], MD  spironolactone (ALDACTONE)  50 MG tablet TAKE 1 TABLET (50 MG TOTAL) BY MOUTH DAILY. 09/10/17  Yes Glean Hess, MD  topiramate (TOPAMAX) 25 MG tablet TAKE 2 TABLETS (50 MG TOTAL) BY MOUTH AT BEDTIME. 02/16/18  Yes Glean Hess, MD    Allergies  Allergen  Reactions  . Levofloxacin Shortness Of Breath    myalgia, constipation  . Ace Inhibitors Cough    One particular medication causes problems - maybe starts with letter L  . Budesonide Nausea And Vomiting  . Prednisone Itching    Past Surgical History:  Procedure Laterality Date  . ANTERIOR CERVICAL DISCECTOMY  2010  . BREAST BIOPSY Left 08/2014   papilloma  . COLONOSCOPY WITH PROPOFOL N/A 08/02/2016   Procedure: COLONOSCOPY WITH PROPOFOL;  Surgeon: [name], MD;  Location: [place];  Service: Endoscopy;  Laterality: N/A;  . COLONOSCOPY WITH PROPOFOL N/A 09/13/2017   Procedure: COLONOSCOPY WITH PROPOFOL;  Surgeon: [name], MD;  Location: [place];  Service: Endoscopy;  Laterality: N/A;  . ENDOSCOPIC HEMILAMINOTOMY W/ DISCECTOMY LUMBAR  2009, 2011  . ESOPHAGEAL DILATION  08/02/2016   Procedure: ESOPHAGEAL DILATION;  Surgeon: [name], MD;  Location: [place];  Service: Endoscopy;;  . ESOPHAGOGASTRODUODENOSCOPY (EGD) WITH PROPOFOL N/A 08/02/2016   Repeat EGD 08/2019. Performed by Dr. [name]  . ESOPHAGOGASTRODUODENOSCOPY (EGD) WITH PROPOFOL N/A 09/13/2017   Procedure: ESOPHAGOGASTRODUODENOSCOPY (EGD) WITH PROPOFOL;  Surgeon: [name], MD;  Location: [place];  Service: Endoscopy;  Laterality: N/A;  . ESOPHAGOGASTRODUODENOSCOPY (EGD) WITH PROPOFOL N/A 10/04/2017   Procedure: ESOPHAGOGASTRODUODENOSCOPY (EGD) WITH PROPOFOL;  Surgeon: [name], MD;  Location: [place];  Service: Endoscopy;  Laterality: N/A;  . POLYPECTOMY  09/13/2017   Procedure: POLYPECTOMY;  Surgeon: [name], MD;  Location: [place];  Service: Endoscopy;;    Social History   Tobacco Use  . Smoking status: Current Every Day Smoker    Packs/day: 0.50    Years: 20.00    Pack years: 10.00    Types: Cigarettes  . Smokeless tobacco: Never Used  Substance Use Topics  . Alcohol use: Yes    Alcohol/week: 0.0 oz    Comment:  occasionally - several times/year  . Drug use: No     Medication list has been reviewed and updated.  Current Meds  Medication Sig  . albuterol (PROVENTIL HFA;VENTOLIN HFA) 108 (90 Base) MCG/ACT inhaler Inhale 2 puffs into the lungs every 6 (six) hours as needed for wheezing or shortness of breath.  Marland Kitchen atorvastatin (LIPITOR) 10 MG tablet TAKE 1 TABLET BY MOUTH EVERY DAY  . buPROPion (WELLBUTRIN SR) 150 MG 12 hr tablet Take 1 tablet (150 mg total) by mouth 2 (two) times daily.  . Butalbital-APAP-Caffeine 50-300-40 MG CAPS Take 1 capsule by mouth 2 (two) times daily as needed. migraine  . dexlansoprazole (DEXILANT) 60 MG capsule Take 1 capsule (60 mg total) by mouth daily.  Marland Kitchen HYDROcodone-acetaminophen (NORCO/VICODIN) 5-325 MG tablet Take 1-2 tablets by mouth every 6 (six) hours as needed.  . irbesartan (AVAPRO) 150 MG tablet TAKE 1 TABLET (150 MG TOTAL) BY MOUTH DAILY.  Marland Kitchen promethazine (PHENERGAN) 25 MG tablet Take 1 tablet (25 mg total) by mouth every 6 (six) hours as needed for nausea or vomiting.  Marland Kitchen spironolactone (ALDACTONE) 50 MG tablet TAKE 1 TABLET (50 MG TOTAL) BY MOUTH DAILY.  Marland Kitchen topiramate (TOPAMAX) 25 MG tablet TAKE 2 TABLETS (50 MG TOTAL) BY MOUTH AT BEDTIME.    PHQ 2/9 Scores 05/05/2018 06/14/2017 06/12/2016  PHQ - 2 Score 0 0 0    Physical Exam  Constitutional: She is oriented to person, place, and time. She appears well-developed. She appears lethargic. No distress.  HENT:  Head: Normocephalic and atraumatic.  Cardiovascular: Normal rate, regular rhythm and normal heart sounds.  Pulmonary/Chest: Effort normal. No respiratory distress. She has decreased breath sounds in the right upper field. She has no rhonchi. She has no rales.  Abdominal: Soft. There is no tenderness.  Musculoskeletal: Normal range of motion.  Lymphadenopathy:    She has no cervical adenopathy.    She has no axillary adenopathy.  Neurological: She is oriented to person, place, and time. She appears  lethargic. No sensory deficit.  Skin: Skin is warm and dry. No rash noted.  Psychiatric: She has a normal mood and affect. Her behavior is normal. Thought content normal.  Nursing note and vitals reviewed.   BP (!) 148/80   Pulse 83   Ht 5\' 5"  (1.651 m)   Wt 173 lb (78.5 kg)   SpO2 99%   BMI 28.79 kg/m   Assessment and Plan: 1. Iron deficiency anemia, unspecified iron deficiency anemia type Check labs, will likely need GI and Hematology evaluation - CBC with Differential/Platelet  2. Gastroesophageal reflux disease with esophagitis Continue PPI  3. Fatigue, unspecified type Suspect due to anemia Will check other labs - Comprehensive metabolic panel - Hemoglobin A1c - TSH  4. Shortness of breath Rule out infiltrate with CXR - hold off on antibiotics until report reviewed - DG Chest 2 View; Future   No orders of the defined types were placed in this encounter.   Partially dictated using Editor, commissioning. Any errors are unintentional.  Halina Maidens, MD [facility]  05/05/2018   There are no diagnoses linked to this encounter.

## 2018-05-06 ENCOUNTER — Other Ambulatory Visit: Payer: Self-pay | Admitting: Internal Medicine

## 2018-05-06 DIAGNOSIS — D509 Iron deficiency anemia, unspecified: Secondary | ICD-10-CM

## 2018-05-06 NOTE — Progress Notes (Signed)
Patient informed. She agrees to see Hematology for further eval.  Please Advise.

## 2018-05-06 NOTE — Progress Notes (Signed)
LVMTCB about labs.

## 2018-05-12 ENCOUNTER — Encounter: Payer: Self-pay | Admitting: Oncology

## 2018-05-12 ENCOUNTER — Inpatient Hospital Stay: Payer: 59

## 2018-05-12 ENCOUNTER — Inpatient Hospital Stay: Payer: 59 | Attending: Oncology | Admitting: Oncology

## 2018-05-12 ENCOUNTER — Ambulatory Visit: Payer: 59

## 2018-05-12 VITALS — BP 134/80 | HR 71 | Temp 97.2°F | Resp 18 | Ht 65.0 in | Wt 176.8 lb

## 2018-05-12 VITALS — BP 155/57 | HR 62 | Temp 95.4°F | Resp 18

## 2018-05-12 DIAGNOSIS — R5383 Other fatigue: Secondary | ICD-10-CM | POA: Diagnosis not present

## 2018-05-12 DIAGNOSIS — E7800 Pure hypercholesterolemia, unspecified: Secondary | ICD-10-CM | POA: Insufficient documentation

## 2018-05-12 DIAGNOSIS — R531 Weakness: Secondary | ICD-10-CM | POA: Insufficient documentation

## 2018-05-12 DIAGNOSIS — Z86010 Personal history of colonic polyps: Secondary | ICD-10-CM | POA: Diagnosis not present

## 2018-05-12 DIAGNOSIS — E785 Hyperlipidemia, unspecified: Secondary | ICD-10-CM | POA: Insufficient documentation

## 2018-05-12 DIAGNOSIS — Z79899 Other long term (current) drug therapy: Secondary | ICD-10-CM | POA: Insufficient documentation

## 2018-05-12 DIAGNOSIS — R109 Unspecified abdominal pain: Secondary | ICD-10-CM | POA: Diagnosis not present

## 2018-05-12 DIAGNOSIS — M5030 Other cervical disc degeneration, unspecified cervical region: Secondary | ICD-10-CM | POA: Diagnosis not present

## 2018-05-12 DIAGNOSIS — Z803 Family history of malignant neoplasm of breast: Secondary | ICD-10-CM | POA: Diagnosis not present

## 2018-05-12 DIAGNOSIS — I10 Essential (primary) hypertension: Secondary | ICD-10-CM | POA: Diagnosis not present

## 2018-05-12 DIAGNOSIS — Z8719 Personal history of other diseases of the digestive system: Secondary | ICD-10-CM | POA: Insufficient documentation

## 2018-05-12 DIAGNOSIS — R634 Abnormal weight loss: Secondary | ICD-10-CM | POA: Diagnosis not present

## 2018-05-12 DIAGNOSIS — F17210 Nicotine dependence, cigarettes, uncomplicated: Secondary | ICD-10-CM | POA: Diagnosis not present

## 2018-05-12 DIAGNOSIS — K219 Gastro-esophageal reflux disease without esophagitis: Secondary | ICD-10-CM

## 2018-05-12 DIAGNOSIS — D509 Iron deficiency anemia, unspecified: Secondary | ICD-10-CM

## 2018-05-12 LAB — CBC WITH DIFFERENTIAL/PLATELET
Basophils Absolute: 0.1 10*3/uL (ref 0–0.1)
Basophils Relative: 1 %
EOS ABS: 0.2 10*3/uL (ref 0–0.7)
EOS PCT: 2 %
HCT: 30.3 % — ABNORMAL LOW (ref 35.0–47.0)
HEMOGLOBIN: 9.6 g/dL — AB (ref 12.0–16.0)
LYMPHS ABS: 3.9 10*3/uL — AB (ref 1.0–3.6)
LYMPHS PCT: 39 %
MCH: 21.8 pg — AB (ref 26.0–34.0)
MCHC: 31.6 g/dL — AB (ref 32.0–36.0)
MCV: 68.9 fL — AB (ref 80.0–100.0)
MONOS PCT: 8 %
Monocytes Absolute: 0.8 10*3/uL (ref 0.2–0.9)
Neutro Abs: 5 10*3/uL (ref 1.4–6.5)
Neutrophils Relative %: 50 %
PLATELETS: 578 10*3/uL — AB (ref 150–440)
RBC: 4.4 MIL/uL (ref 3.80–5.20)
RDW: 19.3 % — ABNORMAL HIGH (ref 11.5–14.5)
WBC: 10 10*3/uL (ref 3.6–11.0)

## 2018-05-12 LAB — URINALYSIS, COMPLETE (UACMP) WITH MICROSCOPIC
Bilirubin Urine: NEGATIVE
Glucose, UA: NEGATIVE mg/dL
Hgb urine dipstick: NEGATIVE
Ketones, ur: NEGATIVE mg/dL
Nitrite: NEGATIVE
PROTEIN: NEGATIVE mg/dL
RBC / HPF: NONE SEEN RBC/hpf (ref 0–5)
pH: 6 (ref 5.0–8.0)

## 2018-05-12 LAB — IRON AND TIBC
IRON: 18 ug/dL — AB (ref 28–170)
SATURATION RATIOS: 3 % — AB (ref 10.4–31.8)
TIBC: 532 ug/dL — AB (ref 250–450)
UIBC: 514 ug/dL

## 2018-05-12 LAB — FERRITIN: Ferritin: 4 ng/mL — ABNORMAL LOW (ref 11–307)

## 2018-05-12 LAB — VITAMIN B12: VITAMIN B 12: 312 pg/mL (ref 180–914)

## 2018-05-12 LAB — FOLATE: FOLATE: 10.5 ng/mL (ref >5.9)

## 2018-05-12 MED ORDER — SODIUM CHLORIDE 0.9 % IV SOLN
510.0000 mg | Freq: Once | INTRAVENOUS | Status: AC
  Administered 2018-05-12: 510 mg via INTRAVENOUS
  Filled 2018-05-12: qty 17

## 2018-05-12 MED ORDER — SODIUM CHLORIDE 0.9 % IV SOLN
Freq: Once | INTRAVENOUS | Status: AC
  Administered 2018-05-12: 15:00:00 via INTRAVENOUS
  Filled 2018-05-12: qty 1000

## 2018-05-12 NOTE — Progress Notes (Signed)
Hematology/Oncology Consult note [facility] Telephone:([phone] [phone]  Patient Care Team: Glean Hess, MD as PCP - General (Internal Medicine) [name], MD as Consulting Physician (Gastroenterology)   Name of the patient: Jacqueline Harmon  [id number]  August 19, 1956    Reason for referral- iron deficiency anemia   Referring physician- Dr. Army Melia  Date of visit: 05/12/18   History of presenting illness-patient is a 62 year old African-American postmenopausal female who has been referred to [place] for iron deficiency anemia.Most recent CBC from 05/05/2018 showed white count of 9.9, H&H of 9.7/31.1 with an MCV of 68.6 and a platelet count of 583.  Iron studies from April 2019 showed a low ferritin of 88 and iron studies which showed a low iron saturation of 4% and elevated TIBC of 463.  On looking back at her prior CBCs patient had a normal hemoglobin between 12-13 up until September 2018.  Since October 2018 her H&H has been gradually drifting down from 11-9 over the last 1 year.  She did see Dr. Allen Norris from GI and underwent EGD as well as colonoscopy.  EGD showed gastritis with evidence of gastric metaplasia and colonoscopy showed polyp which was removed and showed no evidence of malignancy.  There was also localized moderate inflammation found at the ileocecal valve which was biopsied and increased mucosa vascular pattern in the entire examined colon.  Patient reports that she took oral iron for 2 months but stopped taking it about 3 months ago due to nausea.  She denies any consistent use of NSAIDs.  She denies any blood in her stool or urine or dark tarry stools.  Denies any family history of colon cancer.  Reports 14 to 15 pound weight loss over the last 1 year. Marland Kitchen ECOG PS- 1  Pain scale- 0   Review of systems- Review of Systems  Constitutional: Positive for malaise/fatigue and weight loss. Negative for chills and fever.  HENT: Negative for  congestion, ear discharge and nosebleeds.   Eyes: Negative for blurred vision.  Respiratory: Negative for cough, hemoptysis, sputum production, shortness of breath and wheezing.   Cardiovascular: Negative for chest pain, palpitations, orthopnea and claudication.  Gastrointestinal: Negative for abdominal pain, blood in stool, constipation, diarrhea, heartburn, melena, nausea and vomiting.  Genitourinary: Negative for dysuria, flank pain, frequency, hematuria and urgency.  Musculoskeletal: Negative for back pain, joint pain and myalgias.  Skin: Negative for rash.  Neurological: Negative for dizziness, tingling, focal weakness, seizures, weakness and headaches.  Endo/Heme/Allergies: Does not bruise/bleed easily.  Psychiatric/Behavioral: Negative for depression and suicidal ideas. The patient does not have insomnia.     Allergies  Allergen Reactions  . Levofloxacin Shortness Of Breath    myalgia, constipation  . Ace Inhibitors Cough    One particular medication causes problems - maybe starts with letter L  . Budesonide Nausea And Vomiting  . Prednisone Itching    Patient Active Problem List   Diagnosis Date Noted  . Atrophic gastritis without hemorrhage   . Iron deficiency anemia   . Abdominal pain, generalized   . Benign neoplasm of ascending colon   . Gastroesophageal reflux disease with esophagitis 06/14/2017  . Intestinal metaplasia of gastric mucosa 02/11/2017  . Gastritis without bleeding   . Adenomatous colon polyp   . Mixed hyperlipidemia 06/10/2015  . Smokes tobacco daily 03/23/2015  . Essential (primary) hypertension 03/23/2015  . Intraductal papilloma of breast, left 03/23/2015  . Hot flash, menopausal 03/23/2015  . Migraine without aura and responsive to  treatment 03/23/2015  . Phlebectasia 03/23/2015     Past Medical History:  Diagnosis Date  . Anemia   . Bulging of lumbar intervertebral disc   . Degenerative disc disease, cervical   . Headache    migraines -  1x/mo - better since topamax  . Hypercholesteremia   . Hypertension   . Migraines   . Multilevel degenerative disc disease   . Wears dentures    partial upper and lower     Past Surgical History:  Procedure Laterality Date  . ANTERIOR CERVICAL DISCECTOMY  2010  . BREAST BIOPSY Left 08/2014   papilloma  . COLONOSCOPY WITH PROPOFOL N/A 08/02/2016   Procedure: COLONOSCOPY WITH PROPOFOL;  Surgeon: [name], MD;  Location: [place];  Service: Endoscopy;  Laterality: N/A;  . COLONOSCOPY WITH PROPOFOL N/A 09/13/2017   Procedure: COLONOSCOPY WITH PROPOFOL;  Surgeon: [name], MD;  Location: [place];  Service: Endoscopy;  Laterality: N/A;  . ENDOSCOPIC HEMILAMINOTOMY W/ DISCECTOMY LUMBAR  2009, 2011  . ESOPHAGEAL DILATION  08/02/2016   Procedure: ESOPHAGEAL DILATION;  Surgeon: [name], MD;  Location: [place];  Service: Endoscopy;;  . ESOPHAGOGASTRODUODENOSCOPY (EGD) WITH PROPOFOL N/A 08/02/2016   Repeat EGD 08/2019. Performed by Dr. [name]  . ESOPHAGOGASTRODUODENOSCOPY (EGD) WITH PROPOFOL N/A 09/13/2017   Procedure: ESOPHAGOGASTRODUODENOSCOPY (EGD) WITH PROPOFOL;  Surgeon: [name], MD;  Location: [place];  Service: Endoscopy;  Laterality: N/A;  . ESOPHAGOGASTRODUODENOSCOPY (EGD) WITH PROPOFOL N/A 10/04/2017   Procedure: ESOPHAGOGASTRODUODENOSCOPY (EGD) WITH PROPOFOL;  Surgeon: [name], MD;  Location: [place];  Service: Endoscopy;  Laterality: N/A;  . POLYPECTOMY  09/13/2017   Procedure: POLYPECTOMY;  Surgeon: [name], MD;  Location: [place];  Service: Endoscopy;;    Social History   Socioeconomic History  . Marital status: Married    Spouse name: Not on file  . Number of children: Not on file  . Years of education: Not on file  . Highest education level: Not on file  Occupational History  . Occupation: Radio broadcast assistant  Social Needs  . Financial resource strain: Not on file  . Food  insecurity:    Worry: Not on file    Inability: Not on file  . Transportation needs:    Medical: Not on file    Non-medical: Not on file  Tobacco Use  . Smoking status: Current Every Day Smoker    Packs/day: 0.25    Years: 20.00    Pack years: 5.00    Types: Cigarettes  . Smokeless tobacco: Never Used  Substance and Sexual Activity  . Alcohol use: Yes    Alcohol/week: 0.0 oz    Comment: occasionally - several times/year  . Drug use: No  . Sexual activity: Yes    Comment: not a lot  Lifestyle  . Physical activity:    Days per week: Not on file    Minutes per session: Not on file  . Stress: Not on file  Relationships  . Social connections:    Talks on phone: Not on file    Gets together: Not on file    Attends religious service: Not on file    Active member of club or organization: Not on file    Attends meetings of clubs or organizations: Not on file    Relationship status: Not on file  . Intimate partner violence:    Fear of current or ex partner: Not on file    Emotionally abused: Not on file  Physically abused: Not on file    Forced sexual activity: Not on file  Other Topics Concern  . Not on file  Social History Narrative  . Not on file     Family History  Problem Relation Age of Onset  . Breast cancer Mother 53  . Diabetes Father   . Osteoarthritis Father   . Breast cancer Maternal Grandmother 90     Current Outpatient Medications:  .  albuterol (PROVENTIL HFA;VENTOLIN HFA) 108 (90 Base) MCG/ACT inhaler, Inhale 2 puffs into the lungs every 6 (six) hours as needed for wheezing or shortness of breath., Disp: 1 Inhaler, Rfl: 0 .  atorvastatin (LIPITOR) 10 MG tablet, TAKE 1 TABLET BY MOUTH EVERY DAY, Disp: 90 tablet, Rfl: 2 .  Butalbital-APAP-Caffeine 50-300-40 MG CAPS, Take 1 capsule by mouth 2 (two) times daily as needed. migraine, Disp: 60 capsule, Rfl: 5 .  dexlansoprazole (DEXILANT) 60 MG capsule, Take 1 capsule (60 mg total) by mouth daily., Disp: 90  capsule, Rfl: 3 .  irbesartan (AVAPRO) 150 MG tablet, TAKE 1 TABLET (150 MG TOTAL) BY MOUTH DAILY., Disp: 90 tablet, Rfl: 1 .  promethazine (PHENERGAN) 25 MG tablet, Take 1 tablet (25 mg total) by mouth every 6 (six) hours as needed for nausea or vomiting., Disp: 30 tablet, Rfl: 3 .  spironolactone (ALDACTONE) 50 MG tablet, TAKE 1 TABLET (50 MG TOTAL) BY MOUTH DAILY., Disp: 90 tablet, Rfl: 2 .  topiramate (TOPAMAX) 25 MG tablet, TAKE 2 TABLETS (50 MG TOTAL) BY MOUTH AT BEDTIME., Disp: 180 tablet, Rfl: 3 .  buPROPion (WELLBUTRIN SR) 150 MG 12 hr tablet, Take 1 tablet (150 mg total) by mouth 2 (two) times daily., Disp: 180 tablet, Rfl: 1 .  HYDROcodone-acetaminophen (NORCO/VICODIN) 5-325 MG tablet, Take 1-2 tablets by mouth every 6 (six) hours as needed., Disp: 6 tablet, Rfl: 0   Physical exam:  Vitals:   05/12/18 1348  BP: 134/80  Pulse: 71  Resp: 18  Temp: (!) 97.2 F (36.2 C)  TempSrc: Tympanic  Weight: 176 lb 12.9 oz (80.2 kg)  Height: 5\' 5"  (1.651 m)   Physical Exam  Constitutional: She is oriented to person, place, and time. She appears well-developed and well-nourished.  HENT:  Head: Normocephalic and atraumatic.  Eyes: Pupils are equal, round, and reactive to light. EOM are normal.  Neck: Normal range of motion.  Cardiovascular: Normal rate, regular rhythm and normal heart sounds.  Pulmonary/Chest: Effort normal and breath sounds normal.  Abdominal: Soft. Bowel sounds are normal.  Neurological: She is alert and oriented to person, place, and time.  Skin: Skin is warm and dry.       CMP Latest Ref Rng & Units 05/05/2018  Glucose 70 - 99 mg/dL 75  BUN 6 - 20 mg/dL 13  Creatinine 0.44 - 1.00 mg/dL 1.17(H)  Sodium 135 - 145 mmol/L 138  Potassium 3.5 - 5.1 mmol/L 3.9  Chloride 98 - 111 mmol/L 107  CO2 22 - 32 mmol/L 22  Calcium 8.9 - 10.3 mg/dL 9.1  Total Protein 6.5 - 8.1 g/dL 7.7  Total Bilirubin 0.3 - 1.2 mg/dL 0.5  Alkaline Phos 38 - 126 U/L 81  AST 15 - 41 U/L 19    ALT 0 - 44 U/L 13   CBC Latest Ref Rng & Units 05/12/2018  WBC 3.6 - 11.0 K/uL 10.0  Hemoglobin 12.0 - 16.0 g/dL 9.6(L)  Hematocrit 35.0 - 47.0 % 30.3(L)  Platelets 150 - 440 K/uL 578(H)    No images  are attached to the encounter.  Dg Chest 2 View  Result Date: 05/06/2018 CLINICAL DATA:  Chronic weaknesses and lethargy and anemia but increased severity of the symptoms over the past month and accompanied by shortness of breath, productive cough, nausea vomiting, loose stools, and nocturnal sweats and chills. Current smoker. EXAM: CHEST - 2 VIEW COMPARISON:  PA and lateral chest x-ray of Feb 11, 2017 FINDINGS: The lungs are mildly hyperinflated. The interstitial markings are minimally prominent but stable. There is no alveolar infiltrate or pleural effusion. The heart and pulmonary vascularity are normal. The mediastinum is normal in width. The trachea is midline. The bony thorax exhibits no acute abnormality. IMPRESSION: Mild chronic bronchitic-smoking related changes, stable. No pneumonia nor other acute cardiopulmonary abnormality. Electronically Signed   By: David  Martinique M.D.   On: 05/06/2018 07:54    Assessment and plan- Patient is a 62 y.o. female with iron deficiency anemia  Patient has evidence of significant iron deficiency based on the fact that her hemoglobin is 9.6 with chronic severe microcytosis.  Today I will check CBC with differential along with ferritin and iron studies as well as B12 and folate.  I recommend 2 doses of Feraheme 510 mg IV.  Discussed risks and benefits of Feraheme including all but not limited to headache, leg swelling and possible risk of infusion reaction.  Patient understands and agrees to proceed as planned.  I will see her back in 2 months time with repeat CBC ferritin and iron studies.  With regards to etiology for iron deficiency anemia: I did review the EGD and colonoscopy results as well as the pathology reports.  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  However she has severe iron deficiency which has not improved over the last 1 year and her hemoglobin prior to October 2018 was normal.  She has not had a capsule endoscopy done yet and I will touch base with Dr. Allen Norris regarding this  I will reassess her weight loss at 2 months and if she is continuing to lose weight I will consider doing a CT chest abdomen and pelvis to rule out any underlying malignancy   Thank you for this kind referral and the opportunity to participate in the care of this patient   Visit Diagnosis 1. Microcytic anemia     Dr. Randa Evens, MD, MPH [facility] [phone] 05/12/2018 3:39 PM

## 2018-05-12 NOTE — Patient Instructions (Signed)

## 2018-05-14 ENCOUNTER — Inpatient Hospital Stay: Payer: 59

## 2018-05-14 ENCOUNTER — Telehealth: Payer: Self-pay | Admitting: Gastroenterology

## 2018-05-14 DIAGNOSIS — D509 Iron deficiency anemia, unspecified: Secondary | ICD-10-CM | POA: Diagnosis not present

## 2018-05-14 LAB — CELIAC DISEASE PANEL
Endomysial Ab, IgA: NEGATIVE
IgA: 124 mg/dL (ref 87–352)
Tissue Transglutaminase Ab, IgA: <2 U/mL (ref 0–3)

## 2018-05-14 NOTE — Telephone Encounter (Signed)
-----   Message from [place]ie [place], [place] sent at 05/14/2018 10:54 AM EDT ----- Can you please schedule this pt with whoever has an opening soon? Dr. Vicente Males said he could see her maybe next week.   Thanks!  Ginger ----- Message ----- From: Sindy Guadeloupe, MD Sent: 05/13/2018   8:10 AM To: [place], CMA  Hi Ginger,  I saw this patient for iron deficiency anemia seen by Dr. Allen Norris in the past. I understand he is out of town for 3 weeks. Can someone else see her from GI?  Thanks, Astrid Divine

## 2018-05-14 NOTE — Telephone Encounter (Signed)
Left vm for pt to call office and schedule Fu next week with any Doctor

## 2018-05-15 NOTE — Telephone Encounter (Signed)
LEFT VM FOR BOTH # ON FILE TO SCHEDULE APT

## 2018-05-15 NOTE — Telephone Encounter (Signed)
-----   Message from Glennie [place], [place] sent at 05/14/2018 10:54 AM EDT ----- Can you please schedule this pt with whoever has an opening soon? Dr. Vicente Males said he could see her maybe next week.   Thanks!  Ginger ----- Message ----- From: Sindy Guadeloupe, MD Sent: 05/13/2018   8:10 AM To: [place], CMA  Hi Ginger,  I saw this patient for iron deficiency anemia seen by Dr. Allen Norris in the past. I understand he is out of town for 3 weeks. Can someone else see her from GI?  Thanks, Astrid Divine

## 2018-05-16 LAB — H. PYLORI ANTIGEN, STOOL: H. PYLORI STOOL AG, EIA: NEGATIVE

## 2018-05-19 ENCOUNTER — Telehealth: Payer: Self-pay | Admitting: Gastroenterology

## 2018-05-19 ENCOUNTER — Inpatient Hospital Stay: Payer: 59

## 2018-05-19 VITALS — BP 140/80 | HR 81 | Temp 96.9°F | Resp 18

## 2018-05-19 DIAGNOSIS — D509 Iron deficiency anemia, unspecified: Secondary | ICD-10-CM | POA: Diagnosis not present

## 2018-05-19 MED ORDER — SODIUM CHLORIDE 0.9 % IV SOLN
Freq: Once | INTRAVENOUS | Status: AC
  Administered 2018-05-19: 14:00:00 via INTRAVENOUS
  Filled 2018-05-19: qty 1000

## 2018-05-19 MED ORDER — FERUMOXYTOL INJECTION 510 MG/17 ML
INTRAVENOUS | Status: AC
  Filled 2018-05-19: qty 17

## 2018-05-19 MED ORDER — SODIUM CHLORIDE 0.9 % IV SOLN
510.0000 mg | Freq: Once | INTRAVENOUS | Status: AC
  Administered 2018-05-19: 510 mg via INTRAVENOUS
  Filled 2018-05-19: qty 17

## 2018-05-19 NOTE — Telephone Encounter (Signed)
-----   Message from Glennie Isle, [place] sent at 05/14/2018 10:54 AM EDT ----- Can you please schedule this pt with whoever has an opening soon? Dr. Vicente Males said he could see her maybe next week.   Thanks!  Ginger ----- Message ----- From: Sindy Guadel[place], MD Sent: 05/13/2018   8:10 AM To: [place], CMA  Hi Ginger,  I saw this patient for iron deficiency anemia seen by Dr. Allen Norris in the past. I understand he is out of town for 3 weeks. Can someone else see her from GI?  Thanks, Astrid Divine

## 2018-05-19 NOTE — Telephone Encounter (Signed)
Spoke with pt to schedule Fu apt with Dr. Vicente Males per note, she states she has an apt this afternoon and will speak with her Doctor to understand why she needs to bee seen by [place]

## 2018-06-17 ENCOUNTER — Ambulatory Visit (INDEPENDENT_AMBULATORY_CARE_PROVIDER_SITE_OTHER): Payer: 59 | Admitting: Internal Medicine

## 2018-06-17 ENCOUNTER — Encounter: Payer: Self-pay | Admitting: Internal Medicine

## 2018-06-17 VITALS — BP 118/68 | HR 75 | Ht 65.0 in | Wt 172.0 lb

## 2018-06-17 DIAGNOSIS — F17210 Nicotine dependence, cigarettes, uncomplicated: Secondary | ICD-10-CM

## 2018-06-17 DIAGNOSIS — Z1239 Encounter for other screening for malignant neoplasm of breast: Secondary | ICD-10-CM

## 2018-06-17 DIAGNOSIS — K2100 Gastro-esophageal reflux disease with esophagitis, without bleeding: Secondary | ICD-10-CM

## 2018-06-17 DIAGNOSIS — I10 Essential (primary) hypertension: Secondary | ICD-10-CM | POA: Diagnosis not present

## 2018-06-17 DIAGNOSIS — E782 Mixed hyperlipidemia: Secondary | ICD-10-CM | POA: Diagnosis not present

## 2018-06-17 DIAGNOSIS — Z1231 Encounter for screening mammogram for malignant neoplasm of breast: Secondary | ICD-10-CM | POA: Diagnosis not present

## 2018-06-17 DIAGNOSIS — G43009 Migraine without aura, not intractable, without status migrainosus: Secondary | ICD-10-CM | POA: Diagnosis not present

## 2018-06-17 DIAGNOSIS — D509 Iron deficiency anemia, unspecified: Secondary | ICD-10-CM

## 2018-06-17 DIAGNOSIS — Z0000 Encounter for general adult medical examination without abnormal findings: Secondary | ICD-10-CM | POA: Diagnosis not present

## 2018-06-17 DIAGNOSIS — F17200 Nicotine dependence, unspecified, uncomplicated: Secondary | ICD-10-CM

## 2018-06-17 LAB — POCT URINALYSIS DIPSTICK
Bilirubin, UA: NEGATIVE
GLUCOSE UA: NEGATIVE
Ketones, UA: NEGATIVE
LEUKOCYTES UA: NEGATIVE
Nitrite, UA: NEGATIVE
Protein, UA: NEGATIVE
RBC UA: NEGATIVE
Spec Grav, UA: 1.01 (ref 1.010–1.025)
Urobilinogen, UA: 0.2 E.U./dL
pH, UA: 6.5 (ref 5.0–8.0)

## 2018-06-17 MED ORDER — BUTALBITAL-APAP-CAFFEINE 50-300-40 MG PO CAPS: 1.0000 | ORAL_CAPSULE | Freq: Two times a day (BID) | ORAL | 0 refills | Status: AC | PRN

## 2018-06-17 NOTE — Progress Notes (Signed)
Date:  06/17/2018   Name:  Jacqueline Harmon   DOB:  Oct 08, 1956   MRN:  [id number]   Chief Complaint: Annual Exam (Breast Exam. Pap next year. ) Jacqueline Harmon is a 62 y.o. female who presents today for her Complete Annual Exam. She feels well. She reports exercising walking twice a day. She reports she is sleeping well. Mammogram is due in November.  Hypertension  This is a chronic problem. The problem is controlled. Associated symptoms include headaches. Pertinent negatives include no chest pain, palpitations or shortness of breath. Past treatments include angiotensin blockers and diuretics. The current treatment provides significant improvement.  Migraine   This is a recurrent problem. The problem occurs seasonly. The pain quality is similar to prior headaches. Pertinent negatives include no abdominal pain, coughing, dizziness, fever, hearing loss, tinnitus or vomiting. Treatments tried: topamax and butalbital. Her past medical history is significant for hypertension.  Gastroesophageal Reflux  She reports no abdominal pain, no chest pain, no coughing or no wheezing. Pertinent negatives include no fatigue.  She is still struggling with decrease appetite and abdominal complaints.  She has had 2 colonoscopies and 2 EGDs.  She is still seeing Hematology for IDA.  May be having a capsule endoscopy for further workup.  Currently using marijuana intermittently to increase appetite.  Does not trust CBD oil products.    Review of Systems  Constitutional: Positive for appetite change and unexpected weight change. Negative for chills, fatigue and fever.  HENT: Negative for congestion, hearing loss, tinnitus, trouble swallowing and voice change.   Eyes: Negative for visual disturbance.  Respiratory: Negative for cough, chest tightness, shortness of breath and wheezing.   Cardiovascular: Negative for chest pain, palpitations and leg swelling.  Gastrointestinal: Negative for abdominal pain, blood in  stool, constipation, diarrhea and vomiting.  Endocrine: Negative for polydipsia and polyuria.  Genitourinary: Negative for dysuria, frequency, genital sores, vaginal bleeding and vaginal discharge.  Musculoskeletal: Positive for myalgias (in right upper arm). Negative for arthralgias, gait problem and joint swelling.  Skin: Negative for color change and rash.  Neurological: Positive for headaches. Negative for dizziness, tremors and light-headedness.  Hematological: Negative for adenopathy. Does not bruise/bleed easily.  Psychiatric/Behavioral: Negative for decreased concentration, dysphoric mood and sleep disturbance. The patient is not nervous/anxious.     Patient Active Problem List   Diagnosis Date Noted  . Atrophic gastritis without hemorrhage   . Iron deficiency anemia   . Abdominal pain, generalized   . Benign neoplasm of ascending colon   . Gastroesophageal reflux disease with esophagitis 06/14/2017  . Intestinal metaplasia of gastric mucosa 02/11/2017  . Gastritis without bleeding   . Adenomatous colon polyp   . Mixed hyperlipidemia 06/10/2015  . Smokes tobacco daily 03/23/2015  . Essential (primary) hypertension 03/23/2015  . Intraductal papilloma of breast, left 03/23/2015  . Hot flash, menopausal 03/23/2015  . Migraine without aura and responsive to treatment 03/23/2015  . 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 Levofloxacin Shortness Of Breath    myalgia, constipation  . Ace Inhibitors Cough    One particular medication causes problems - maybe starts with letter L  . Budesonide Nausea And Vomiting  . Prednisone Itching    Past Surgical History:  Procedure Laterality Date  . ANTERIOR CERVICAL DISCECTOMY  2010  . BREAST BIOPSY Left 08/2014   papilloma  . COLONOSCOPY WITH PROPOFOL N/A 08/02/2016   Procedure: COLONOSCOPY WITH PROPOFOL;  Surgeon: [name], MD;  Location:  [place];  Service: Endoscopy;  Laterality: N/A;  .  COLONOSCOPY WITH PROPOFOL N/A 09/13/2017   Procedure: COLONOSCOPY WITH PROPOFOL;  Surgeon: [name], MD;  Location: [place];  Service: Endoscopy;  Laterality: N/A;  . ENDOSCOPIC HEMILAMINOTOMY W/ DISCECTOMY LUMBAR  2009, 2011  . ESOPHAGEAL DILATION  08/02/2016   Procedure: ESOPHAGEAL DILATION;  Surgeon: [name], MD;  Location: [place];  Service: Endoscopy;;  . ESOPHAGOGASTRODUODENOSCOPY (EGD) WITH PROPOFOL N/A 08/02/2016   Repeat EGD 08/2019. Performed by Dr. [name]  . ESOPHAGOGASTRODUODENOSCOPY (EGD) WITH PROPOFOL N/A 09/13/2017   Procedure: ESOPHAGOGASTRODUODENOSCOPY (EGD) WITH PROPOFOL;  Surgeon: [name], MD;  Location: [place];  Service: Endoscopy;  Laterality: N/A;  . ESOPHAGOGASTRODUODENOSCOPY (EGD) WITH PROPOFOL N/A 10/04/2017   Procedure: ESOPHAGOGASTRODUODENOSCOPY (EGD) WITH PROPOFOL;  Surgeon: [name], MD;  Location: [place];  Service: Endoscopy;  Laterality: N/A;  . POLYPECTOMY  09/13/2017   Procedure: POLYPECTOMY;  Surgeon: [name], MD;  Location: [place];  Service: Endoscopy;;    Social History   Tobacco Use  . Smoking status: Current Every Day Smoker    Packs/day: 0.25    Years: 20.00    Pack years: 5.00    Types: Cigarettes  . Smokeless tobacco: Never Used  Substance Use Topics  . Alcohol use: Yes    Alcohol/week: 0.0 standard drinks    Comment: occasionally - several times/year  . Drug use: No     Medication list has been reviewed and updated.  Current Meds  Medication Sig  . albuterol (PROVENTIL HFA;VENTOLIN HFA) 108 (90 Base) MCG/ACT inhaler Inhale 2 puffs into the lungs every 6 (six) hours as needed for wheezing or shortness of breath.  Marland Kitchen atorvastatin (LIPITOR) 10 MG tablet TAKE 1 TABLET BY MOUTH EVERY DAY  . Butalbital-APAP-Caffeine 50-300-40 MG CAPS Take 1 capsule by mouth 2 (two) times daily as needed. migraine  . dexlansoprazole (DEXILANT) 60 MG capsule Take 1 capsule (60  mg total) by mouth daily.  . irbesartan (AVAPRO) 150 MG tablet TAKE 1 TABLET (150 MG TOTAL) BY MOUTH DAILY. (Patient taking differently: Take 75 mg by mouth daily. )  . promethazine (PHENERGAN) 25 MG tablet Take 1 tablet (25 mg total) by mouth every 6 (six) hours as needed for nausea or vomiting.  Marland Kitchen spironolactone (ALDACTONE) 50 MG tablet TAKE 1 TABLET (50 MG TOTAL) BY MOUTH DAILY. (Patient taking differently: Take 25 mg by mouth daily. )  . topiramate (TOPAMAX) 25 MG tablet TAKE 2 TABLETS (50 MG TOTAL) BY MOUTH AT BEDTIME.  . [DISCONTINUED] Butalbital-APAP-Caffeine 50-300-40 MG CAPS Take 1 capsule by mouth 2 (two) times daily as needed. migraine    PHQ 2/9 Scores 06/17/2018 05/05/2018 06/14/2017 06/12/2016  PHQ - 2 Score 0 0 0 0    Physical Exam  Constitutional: She is oriented to person, place, and time. She appears well-developed and well-nourished. No distress.  HENT:  Head: Normocephalic and atraumatic.  Right Ear: Tympanic membrane and ear canal normal.  Left Ear: Tympanic membrane and ear canal normal.  Nose: Right sinus exhibits no maxillary sinus tenderness. Left sinus exhibits no maxillary sinus tenderness.  Mouth/Throat: Uvula is midline and oropharynx is clear and moist.  Eyes: Conjunctivae and EOM are normal. Right eye exhibits no discharge. Left eye exhibits no discharge. No scleral icterus.  Neck: Normal range of motion. Carotid bruit is not present. No erythema present. No thyromegaly present.  Cardiovascular: Normal rate, regular rhythm, normal heart sounds and normal pulses.  Pulmonary/Chest:  Effort normal. No respiratory distress. She has no wheezes. Right breast exhibits no mass, no nipple discharge, no skin change and no tenderness. Left breast exhibits no mass, no nipple discharge, no skin change and no tenderness.  Abdominal: Soft. Bowel sounds are normal. There is no hepatosplenomegaly. There is no tenderness. There is no CVA tenderness.  Musculoskeletal: Normal range of  motion.  Lymphadenopathy:    She has no cervical adenopathy.    She has no axillary adenopathy.  Neurological: She is alert and oriented to person, place, and time. She has normal reflexes. No cranial nerve deficit or sensory deficit.  Skin: Skin is warm, dry and intact. No rash noted.  Psychiatric: She has a normal mood and affect. Her speech is normal and behavior is normal. Thought content normal.  Nursing note and vitals reviewed.  Wt Readings from Last 3 Encounters:  06/17/18 172 lb (78 kg)  05/12/18 176 lb 12.9 oz (80.2 kg)  05/05/18 173 lb (78.5 kg)    BP 118/68 (BP Location: Right Arm, Patient Position: Sitting, Cuff Size: Normal)   Pulse 75   Ht 5\' 5"  (1.651 m)   Wt 172 lb (78 kg)   SpO2 100%   BMI 28.62 kg/m   Assessment and Plan: 1. Annual physical exam Continue regular exercise - POCT urinalysis dipstick  2. Breast cancer screening Schedule at [facility] - MM 3D SCREEN BREAST BILATERAL; Future  3. Essential (primary) hypertension controlled - CBC with Differential/Platelet - Comprehensive metabolic panel - TSH  4. Migraine without aura and responsive to treatment Stable, decreased frequency on Topamax - Butalbital-APAP-Caffeine 50-300-40 MG CAPS; Take 1 capsule by mouth 2 (two) times daily as needed. migraine  Dispense: 60 capsule; Refill: 0  5. Gastroesophageal reflux disease with esophagitis Continue Dexilant Consider trial off of Topamax due to decreased appetite  6. Mixed hyperlipidemia - Lipid panel  7. Iron deficiency anemia, unspecified iron deficiency anemia type Followed by Hematology Further workup pending - CBC with Differential/Platelet - Iron, TIBC and Ferritin Panel  8. Smokes tobacco daily Pt encouraged to continue to cut back to quit completely   Meds ordered this encounter  Medications  . Butalbital-APAP-Caffeine 50-300-40 MG CAPS    Sig: Take 1 capsule by mouth 2 (two) times daily as needed. migraine    Dispense:  60 capsule     Refill:  0    Partially dictated using Editor, commissioning. Any errors are unintentional.  Halina Maidens, MD [facility]  06/17/2018

## 2018-06-18 LAB — COMPREHENSIVE METABOLIC PANEL
A/G RATIO: 1.4 (ref 1.2–2.2)
ALK PHOS: 111 IU/L (ref 39–117)
ALT: 19 IU/L (ref 0–32)
AST: 18 IU/L (ref 0–40)
Albumin: 4.2 g/dL (ref 3.6–4.8)
BUN/Creatinine Ratio: 11 — ABNORMAL LOW (ref 12–28)
BUN: 12 mg/dL (ref 8–27)
CALCIUM: 9.8 mg/dL (ref 8.7–10.3)
CO2: 21 mmol/L (ref 20–29)
CREATININE: 1.13 mg/dL — AB (ref 0.57–1.00)
Chloride: 106 mmol/L (ref 96–106)
GFR calc non Af Amer: 53 mL/min/{1.73_m2} — ABNORMAL LOW (ref >59)
GFR, EST AFRICAN AMERICAN: 61 mL/min/{1.73_m2} (ref >59)
GLOBULIN, TOTAL: 2.9 g/dL (ref 1.5–4.5)
Glucose: 45 mg/dL — ABNORMAL LOW (ref 65–99)
Potassium: 4.6 mmol/L (ref 3.5–5.2)
Sodium: 143 mmol/L (ref 134–144)
Total Protein: 7.1 g/dL (ref 6.0–8.5)

## 2018-06-18 LAB — CBC WITH DIFFERENTIAL/PLATELET
BASOS: 1 %
Basophils Absolute: 0.1 10*3/uL (ref 0.0–0.2)
EOS (ABSOLUTE): 0.2 10*3/uL (ref 0.0–0.4)
Eos: 2 %
HEMATOCRIT: 39.1 % (ref 34.0–46.6)
Hemoglobin: 12.4 g/dL (ref 11.1–15.9)
IMMATURE GRANS (ABS): 0 10*3/uL (ref 0.0–0.1)
Immature Granulocytes: 0 %
LYMPHS: 35 %
Lymphocytes Absolute: 3.4 10*3/uL — ABNORMAL HIGH (ref 0.7–3.1)
MCH: 24.4 pg — AB (ref 26.6–33.0)
MCHC: 31.7 g/dL (ref 31.5–35.7)
MCV: 77 fL — AB (ref 79–97)
MONOS ABS: 0.9 10*3/uL (ref 0.1–0.9)
Monocytes: 9 %
NEUTROS ABS: 5.1 10*3/uL (ref 1.4–7.0)
NEUTROS PCT: 53 %
PLATELETS: 463 10*3/uL — AB (ref 150–450)
RBC: 5.09 x10E6/uL (ref 3.77–5.28)
WBC: 9.7 10*3/uL (ref 3.4–10.8)

## 2018-06-18 LAB — LIPID PANEL
Chol/HDL Ratio: 3 ratio (ref 0.0–4.4)
Cholesterol, Total: 160 mg/dL (ref 100–199)
HDL: 54 mg/dL (ref >39)
LDL CALC: 81 mg/dL (ref 0–99)
Triglycerides: 127 mg/dL (ref 0–149)
VLDL CHOLESTEROL CAL: 25 mg/dL (ref 5–40)

## 2018-06-18 LAB — IRON,TIBC AND FERRITIN PANEL
FERRITIN: 223 ng/mL — AB (ref 15–150)
Iron Saturation: 25 % (ref 15–55)
Iron: 93 ug/dL (ref 27–139)
Total Iron Binding Capacity: 367 ug/dL (ref 250–450)
UIBC: 274 ug/dL (ref 118–369)

## 2018-06-18 LAB — TSH: TSH: 1.37 u[IU]/mL (ref 0.450–4.500)

## 2018-07-01 ENCOUNTER — Inpatient Hospital Stay: Admission: RE | Admit: 2018-07-01 | Payer: 59 | Source: Ambulatory Visit

## 2018-07-07 ENCOUNTER — Inpatient Hospital Stay: Payer: 59 | Attending: Oncology | Admitting: Oncology

## 2018-07-07 ENCOUNTER — Inpatient Hospital Stay: Payer: 59

## 2018-07-07 ENCOUNTER — Encounter: Payer: Self-pay | Admitting: Oncology

## 2018-07-07 VITALS — BP 164/73 | HR 86 | Temp 97.7°F | Resp 18 | Ht 65.0 in | Wt 170.5 lb

## 2018-07-07 DIAGNOSIS — M5030 Other cervical disc degeneration, unspecified cervical region: Secondary | ICD-10-CM

## 2018-07-07 DIAGNOSIS — F17210 Nicotine dependence, cigarettes, uncomplicated: Secondary | ICD-10-CM | POA: Diagnosis not present

## 2018-07-07 DIAGNOSIS — Z803 Family history of malignant neoplasm of breast: Secondary | ICD-10-CM

## 2018-07-07 DIAGNOSIS — R5383 Other fatigue: Secondary | ICD-10-CM | POA: Diagnosis not present

## 2018-07-07 DIAGNOSIS — Z79899 Other long term (current) drug therapy: Secondary | ICD-10-CM | POA: Diagnosis not present

## 2018-07-07 DIAGNOSIS — I10 Essential (primary) hypertension: Secondary | ICD-10-CM | POA: Insufficient documentation

## 2018-07-07 DIAGNOSIS — E7800 Pure hypercholesterolemia, unspecified: Secondary | ICD-10-CM | POA: Insufficient documentation

## 2018-07-07 DIAGNOSIS — R531 Weakness: Secondary | ICD-10-CM | POA: Insufficient documentation

## 2018-07-07 DIAGNOSIS — R634 Abnormal weight loss: Secondary | ICD-10-CM | POA: Diagnosis not present

## 2018-07-07 DIAGNOSIS — D509 Iron deficiency anemia, unspecified: Secondary | ICD-10-CM | POA: Diagnosis present

## 2018-07-07 LAB — IRON AND TIBC
IRON: 64 ug/dL (ref 28–170)
Saturation Ratios: 17 % (ref 10.4–31.8)
TIBC: 376 ug/dL (ref 250–450)
UIBC: 312 ug/dL

## 2018-07-07 LAB — CBC
HCT: 38.1 % (ref 35.0–47.0)
Hemoglobin: 12.6 g/dL (ref 12.0–16.0)
MCH: 26.3 pg (ref 26.0–34.0)
MCHC: 33.1 g/dL (ref 32.0–36.0)
MCV: 79.6 fL — ABNORMAL LOW (ref 80.0–100.0)
PLATELETS: 399 10*3/uL (ref 150–440)
RBC: 4.79 MIL/uL (ref 3.80–5.20)
RDW: 28.5 % — AB (ref 11.5–14.5)
WBC: 8.6 10*3/uL (ref 3.6–11.0)

## 2018-07-07 LAB — FERRITIN: Ferritin: 81 ng/mL (ref 11–307)

## 2018-07-07 NOTE — Progress Notes (Signed)
No new changes noted today 

## 2018-07-07 NOTE — Progress Notes (Signed)
Hematology/Oncology Consult note [facility]  Telephone:([phone] [phone]  Patient Care Team: Glean Hess, MD as PCP - General (Internal Medicine) [name], MD as Consulting Physician (Gastroenterology)   Name of the patient: Jacqueline Harmon  [id number]  08-05-1956   Date of visit: 07/07/18  Diagnosis-iron deficiency anemia  Chief complaint/ Reason for visit-routine follow-up of iron deficiency anemia  Heme/Onc history: patient is a 62 year old African-American postmenopausal female who has been referred to [place] for iron deficiency anemia.Most recent CBC from 05/05/2018 showed white count of 9.9, H&H of 9.7/31.1 with an MCV of 68.6 and a platelet count of 583.  Iron studies from April 2019 showed a low ferritin of 88 and iron studies which showed a low iron saturation of 4% and elevated TIBC of 463.  On looking back at her prior CBCs patient had a normal hemoglobin between 12-13 up until September 2018.  Since October 2018 her H&H has been gradually drifting down from 11-9 over the last 1 year.  She did see Dr. Allen Norris from GI and underwent EGD as well as colonoscopy.  EGD showed gastritis with evidence of gastric metaplasia and colonoscopy showed polyp which was removed and showed no evidence of malignancy.  There was also localized moderate inflammation found at the ileocecal valve which was biopsied and increased mucosa vascular pattern in the entire examined colon.  Patient reports that she took oral iron for 2 months but stopped taking it about 3 months ago due to nausea.  She denies any consistent use of NSAIDs.  She denies any blood in her stool or urine or dark tarry stools.  Denies any family history of colon cancer.  Reports 14 to 15 pound weight loss over the last 1 year.  Results of blood work to 05/12/2018 were as follows: CBC showed white count of 10, H&H of 9.6/30.3 with an MCV of 68.9 and a platelet count of 578.  Ferritin levels were  low at 4.  Iron studies showed elevated TIBC of 532 and low iron saturation of 3%.  B12 level was normal at 312.  Folate was normal at 10.5.  Celiac disease panel was negative.  Urinalysis did not reveal any hematuria.  Patient received 2 doses of Feraheme in August 2019.   Interval history-reports that her energy levels are better after she received 2 doses of iron.  Denies any blood in her stool or urine or dark melanotic stools.  She is down to 170 pounds and reports that she has lost about 15 to 20 pounds over the last 1 year.  She smokes about half a pack of cigarettes per day  ECOG PS- 1 Pain scale- 0 Opioid associated constipation- no  Review of systems- Review of Systems  Constitutional: Positive for malaise/fatigue and weight loss. Negative for chills and fever.  HENT: Negative for congestion, ear discharge and nosebleeds.   Eyes: Negative for blurred vision.  Respiratory: Negative for cough, hemoptysis, sputum production, shortness of breath and wheezing.   Cardiovascular: Negative for chest pain, palpitations, orthopnea and claudication.  Gastrointestinal: Negative for abdominal pain, blood in stool, constipation, diarrhea, heartburn, melena, nausea and vomiting.  Genitourinary: Negative for dysuria, flank pain, frequency, hematuria and urgency.  Musculoskeletal: Negative for back pain, joint pain and myalgias.  Skin: Negative for rash.  Neurological: Negative for dizziness, tingling, focal weakness, seizures, weakness and headaches.  Endo/Heme/Allergies: Does not bruise/bleed easily.  Psychiatric/Behavioral: Negative for depression and suicidal ideas. The patient does not have  insomnia.        Allergies  Allergen Reactions  . Levofloxacin Shortness Of Breath    myalgia, constipation  . Ace Inhibitors Cough    One particular medication causes problems - maybe starts with letter L  . Budesonide Nausea And Vomiting  . Prednisone Itching     Past Medical History:    Diagnosis Date  . Anemia   . Bulging of lumbar intervertebral disc   . Degenerative disc disease, cervical   . Headache    migraines - 1x/mo - better since topamax  . Hypercholesteremia   . Hypertension   . Migraines   . Multilevel degenerative disc disease   . Wears dentures    partial upper and lower     Past Surgical History:  Procedure Laterality Date  . ANTERIOR CERVICAL DISCECTOMY  2010  . BREAST BIOPSY Left 08/2014   papilloma  . COLONOSCOPY WITH PROPOFOL N/A 08/02/2016   Procedure: COLONOSCOPY WITH PROPOFOL;  Surgeon: [name], MD;  Location: [place];  Service: Endoscopy;  Laterality: N/A;  . COLONOSCOPY WITH PROPOFOL N/A 09/13/2017   Procedure: COLONOSCOPY WITH PROPOFOL;  Surgeon: [name], MD;  Location: [place];  Service: Endoscopy;  Laterality: N/A;  . ENDOSCOPIC HEMILAMINOTOMY W/ DISCECTOMY LUMBAR  2009, 2011  . ESOPHAGEAL DILATION  08/02/2016   Procedure: ESOPHAGEAL DILATION;  Surgeon: [name], MD;  Location: [place];  Service: Endoscopy;;  . ESOPHAGOGASTRODUODENOSCOPY (EGD) WITH PROPOFOL N/A 08/02/2016   Repeat EGD 08/2019. Performed by Dr. [name]  . ESOPHAGOGASTRODUODENOSCOPY (EGD) WITH PROPOFOL N/A 09/13/2017   Procedure: ESOPHAGOGASTRODUODENOSCOPY (EGD) WITH PROPOFOL;  Surgeon: [name], MD;  Location: [place];  Service: Endoscopy;  Laterality: N/A;  . ESOPHAGOGASTRODUODENOSCOPY (EGD) WITH PROPOFOL N/A 10/04/2017   Procedure: ESOPHAGOGASTRODUODENOSCOPY (EGD) WITH PROPOFOL;  Surgeon: [name], MD;  Location: [place];  Service: Endoscopy;  Laterality: N/A;  . POLYPECTOMY  09/13/2017   Procedure: POLYPECTOMY;  Surgeon: [name], MD;  Location: [place];  Service: Endoscopy;;    Social History   Socioeconomic History  . Marital status: Married    Spouse name: Not on file  . Number of children: Not on file  . Years of education: Not on file  . Highest education  level: Not on file  Occupational History  . Occupation: Radio broadcast assistant  Social Needs  . Financial resource strain: Not on file  . Food insecurity:    Worry: Not on file    Inability: Not on file  . Transportation needs:    Medical: Not on file    Non-medical: Not on file  Tobacco Use  . Smoking status: Current Every Day Smoker    Packs/day: 0.25    Years: 20.00    Pack years: 5.00    Types: Cigarettes  . Smokeless tobacco: Never Used  Substance and Sexual Activity  . Alcohol use: Yes    Alcohol/week: 0.0 standard drinks    Comment: occasionally - several times/year  . Drug use: No  . Sexual activity: Yes    Comment: not a lot  Lifestyle  . Physical activity:    Days per week: Not on file    Minutes per session: Not on file  . Stress: Not on file  Relationships  . Social connections:    Talks on phone: Not on file    Gets together: Not on file    Attends religious service: Not on file    Active member of club or organization: Not  on file    Attends meetings of clubs or organizations: Not on file    Relationship status: Not on file  . Intimate partner violence:    Fear of current or ex partner: Not on file    Emotionally abused: Not on file    Physically abused: Not on file    Forced sexual activity: Not on file  Other Topics Concern  . Not on file  Social History Narrative  . Not on file    Family History  Problem Relation Age of Onset  . Breast cancer Mother 58  . Diabetes Father   . Osteoarthritis Father   . Breast cancer Maternal Grandmother 90     Current Outpatient Medications:  .  atorvastatin (LIPITOR) 10 MG tablet, TAKE 1 TABLET BY MOUTH EVERY DAY, Disp: 90 tablet, Rfl: 2 .  dexlansoprazole (DEXILANT) 60 MG capsule, Take 1 capsule (60 mg total) by mouth daily., Disp: 90 capsule, Rfl: 3 .  irbesartan (AVAPRO) 150 MG tablet, TAKE 1 TABLET (150 MG TOTAL) BY MOUTH DAILY. (Patient taking differently: Take 75 mg by mouth daily. ), Disp: 90 tablet, Rfl: 1 .   spironolactone (ALDACTONE) 50 MG tablet, TAKE 1 TABLET (50 MG TOTAL) BY MOUTH DAILY. (Patient taking differently: Take 25 mg by mouth daily. ), Disp: 90 tablet, Rfl: 2 .  topiramate (TOPAMAX) 25 MG tablet, TAKE 2 TABLETS (50 MG TOTAL) BY MOUTH AT BEDTIME., Disp: 180 tablet, Rfl: 3 .  albuterol (PROVENTIL HFA;VENTOLIN HFA) 108 (90 Base) MCG/ACT inhaler, Inhale 2 puffs into the lungs every 6 (six) hours as needed for wheezing or shortness of breath. (Patient not taking: Reported on 07/07/2018), Disp: 1 Inhaler, Rfl: 0 .  Butalbital-APAP-Caffeine 50-300-40 MG CAPS, Take 1 capsule by mouth 2 (two) times daily as needed. migraine (Patient not taking: Reported on 07/07/2018), Disp: 60 capsule, Rfl: 0 .  promethazine (PHENERGAN) 25 MG tablet, Take 1 tablet (25 mg total) by mouth every 6 (six) hours as needed for nausea or vomiting. (Patient not taking: Reported on 07/07/2018), Disp: 30 tablet, Rfl: 3  Physical exam:  Vitals:   07/07/18 1336  BP: (!) 164/73  Pulse: 86  Resp: 18  Temp: 97.7 F (36.5 C)  TempSrc: Tympanic  SpO2: 100%  Weight: 170 lb 8.4 oz (77.3 kg)  Height: 5\' 5"  (1.651 m)   Physical Exam  Constitutional: She is oriented to person, place, and time. She appears well-developed and well-nourished.  HENT:  Head: Normocephalic and atraumatic.  Eyes: Pupils are equal, round, and reactive to light. EOM are normal.  Neck: Normal range of motion.  Cardiovascular: Normal rate, regular rhythm and normal heart sounds.  Pulmonary/Chest: Effort normal and breath sounds normal.  Abdominal: Soft. Bowel sounds are normal.  Neurological: She is alert and oriented to person, place, and time.  Skin: Skin is warm and dry.     CMP Latest Ref Rng & Units 06/17/2018  Glucose 65 - 99 mg/dL 45(L)  BUN 8 - 27 mg/dL 12  Creatinine 0.57 - 1.00 mg/dL 1.13(H)  Sodium 134 - 144 mmol/L 143  Potassium 3.5 - 5.2 mmol/L 4.6  Chloride 96 - 106 mmol/L 106  CO2 20 - 29 mmol/L 21  Calcium 8.7 - 10.3 mg/dL 9.8    Total Protein 6.0 - 8.5 g/dL 7.1  Total Bilirubin 0.0 - 1.2 mg/dL <0.2  Alkaline Phos 39 - 117 IU/L 111  AST 0 - 40 IU/L 18  ALT 0 - 32 IU/L 19   CBC Latest Ref  Rng & Units 07/07/2018  WBC 3.6 - 11.0 K/uL 8.6  Hemoglobin 12.0 - 16.0 g/dL 12.6  Hematocrit 35.0 - 47.0 % 38.1  Platelets 150 - 440 K/uL 399    No images are attached to the encounter.  No results found.   Assessment and plan- Patient is a 62 y.o. female with iron deficiency anemia  Iron deficiency anemia: Hemoglobin is significantly improved from 9.6-12.6.  Iron studies from today are pending.  If iron studies continue to be low I will give her 2 more doses of Feraheme.  Patient has had EGD and colonoscopy but has not had a capsule endoscopy.  Would like to hold off on that at this time.  We will repeat her iron studies in 3 in 6 months and I will see her back in 6 months.  If her iron studies are low at 3 months she is willing to consider capsule endoscopy at that time  Patient is concerned about her ongoing weight loss and I recommended getting see US abdomen and pelvis to rule out malignancy especially given her history of smoking.  She did have a CT abdomen back in December 2018 which did not reveal any evidence of malignancy.  She will call us in the next couple of weeks and let us know if she would like to proceed with CT scan   Visit Diagnosis 1. Iron deficiency anemia, unspecified iron deficiency anemia type      Dr. Randa Evens, MD, MPH [facility] [phone] 07/07/2018 2:55 PM

## 2018-07-15 ENCOUNTER — Ambulatory Visit: Payer: 59 | Admitting: Internal Medicine

## 2018-08-14 ENCOUNTER — Ambulatory Visit
Admission: RE | Admit: 2018-08-14 | Discharge: 2018-08-14 | Disposition: A | Discharge status: Home / Self Care | Payer: 59 | Source: Ambulatory Visit | Attending: Internal Medicine | Admitting: Internal Medicine

## 2018-08-14 DIAGNOSIS — Z1239 Encounter for other screening for malignant neoplasm of breast: Secondary | ICD-10-CM | POA: Diagnosis present

## 2018-08-31 ENCOUNTER — Other Ambulatory Visit: Payer: Self-pay | Admitting: Internal Medicine

## 2018-08-31 DIAGNOSIS — K2100 Gastro-esophageal reflux disease with esophagitis, without bleeding: Secondary | ICD-10-CM

## 2018-09-02 ENCOUNTER — Other Ambulatory Visit: Payer: Self-pay | Admitting: Internal Medicine

## 2018-09-05 ENCOUNTER — Other Ambulatory Visit: Payer: Self-pay | Admitting: Internal Medicine

## 2018-09-05 DIAGNOSIS — I10 Essential (primary) hypertension: Secondary | ICD-10-CM

## 2018-10-06 ENCOUNTER — Inpatient Hospital Stay: Payer: 59 | Attending: Nurse Practitioner | Admitting: Oncology

## 2018-10-06 ENCOUNTER — Other Ambulatory Visit: Payer: Self-pay | Admitting: Nurse Practitioner

## 2018-10-06 DIAGNOSIS — D509 Iron deficiency anemia, unspecified: Secondary | ICD-10-CM | POA: Insufficient documentation

## 2018-10-06 LAB — CBC
HCT: 38.7 % (ref 36.0–46.0)
HEMOGLOBIN: 12.6 g/dL (ref 12.0–15.0)
MCH: 28.4 pg (ref 26.0–34.0)
MCHC: 32.6 g/dL (ref 30.0–36.0)
MCV: 87.2 fL (ref 80.0–100.0)
NRBC: 0 % (ref 0.0–0.2)
PLATELETS: 430 10*3/uL — AB (ref 150–400)
RBC: 4.44 MIL/uL (ref 3.87–5.11)
RDW: 14.9 % (ref 11.5–15.5)
WBC: 7.7 10*3/uL (ref 4.0–10.5)

## 2018-10-06 LAB — IRON AND TIBC
Iron: 39 ug/dL (ref 28–170)
Saturation Ratios: 10 % — ABNORMAL LOW (ref 10.4–31.8)
TIBC: 410 ug/dL (ref 250–450)
UIBC: 371 ug/dL

## 2018-10-06 LAB — FERRITIN: Ferritin: 18 ng/mL (ref 11–307)

## 2018-10-13 NOTE — Progress Notes (Signed)
Called patient to let her know that her ferritin had dropped from 81 to 18.  Her hemoglobin has not dropped it is still 12.6.  Dr. Janese Banks suggested that we give her 2 more doses of Feraheme so that her hemoglobin does not start dropping.  She is agreeable to 2 doses of Feraheme.  I have sent a message to Verdis Frederickson to schedule it and call patient with the new appointments.  Patient is agreeable to the above plan

## 2018-10-14 ENCOUNTER — Other Ambulatory Visit: Payer: Self-pay | Admitting: Oncology

## 2018-10-23 ENCOUNTER — Inpatient Hospital Stay: Payer: 59 | Attending: Oncology

## 2018-10-23 DIAGNOSIS — D500 Iron deficiency anemia secondary to blood loss (chronic): Secondary | ICD-10-CM | POA: Insufficient documentation

## 2018-10-23 DIAGNOSIS — Z79899 Other long term (current) drug therapy: Secondary | ICD-10-CM | POA: Insufficient documentation

## 2018-10-30 ENCOUNTER — Inpatient Hospital Stay: Payer: 59

## 2018-10-30 VITALS — BP 147/81 | HR 80 | Temp 95.7°F | Resp 18

## 2018-10-30 DIAGNOSIS — Z79899 Other long term (current) drug therapy: Secondary | ICD-10-CM | POA: Diagnosis not present

## 2018-10-30 DIAGNOSIS — D500 Iron deficiency anemia secondary to blood loss (chronic): Secondary | ICD-10-CM | POA: Diagnosis not present

## 2018-10-30 DIAGNOSIS — D509 Iron deficiency anemia, unspecified: Secondary | ICD-10-CM

## 2018-10-30 MED ORDER — SODIUM CHLORIDE 0.9 % IV SOLN
Freq: Once | INTRAVENOUS | Status: AC
  Administered 2018-10-30: 14:00:00 via INTRAVENOUS
  Filled 2018-10-30: qty 250

## 2018-10-30 MED ORDER — SODIUM CHLORIDE 0.9 % IV SOLN
510.0000 mg | INTRAVENOUS | Status: DC
  Administered 2018-10-30: 510 mg via INTRAVENOUS
  Filled 2018-10-30: qty 17

## 2018-11-06 ENCOUNTER — Inpatient Hospital Stay: Payer: 59

## 2018-11-10 ENCOUNTER — Inpatient Hospital Stay: Payer: 59 | Attending: Oncology

## 2018-11-10 VITALS — BP 134/79 | HR 71 | Temp 98.1°F | Resp 18

## 2018-11-10 DIAGNOSIS — D509 Iron deficiency anemia, unspecified: Secondary | ICD-10-CM | POA: Insufficient documentation

## 2018-11-10 MED ORDER — SODIUM CHLORIDE 0.9 % IV SOLN
Freq: Once | INTRAVENOUS | Status: AC
  Administered 2018-11-10: 14:00:00 via INTRAVENOUS
  Filled 2018-11-10: qty 250

## 2018-11-10 MED ORDER — SODIUM CHLORIDE 0.9 % IV SOLN
510.0000 mg | INTRAVENOUS | Status: DC
  Administered 2018-11-10: 510 mg via INTRAVENOUS
  Filled 2018-11-10: qty 17

## 2018-12-01 ENCOUNTER — Other Ambulatory Visit: Payer: Self-pay | Admitting: Internal Medicine

## 2018-12-01 DIAGNOSIS — I10 Essential (primary) hypertension: Secondary | ICD-10-CM

## 2018-12-03 ENCOUNTER — Ambulatory Visit: Payer: 59 | Admitting: Internal Medicine

## 2019-01-05 ENCOUNTER — Other Ambulatory Visit: Payer: 59

## 2019-01-05 ENCOUNTER — Ambulatory Visit: Payer: 59 | Admitting: Oncology

## 2019-01-09 ENCOUNTER — Other Ambulatory Visit: Payer: Self-pay

## 2019-01-12 ENCOUNTER — Ambulatory Visit: Payer: 59 | Admitting: Oncology

## 2019-01-12 ENCOUNTER — Other Ambulatory Visit: Payer: Self-pay

## 2019-01-12 ENCOUNTER — Inpatient Hospital Stay: Payer: 59 | Attending: Oncology

## 2019-01-13 ENCOUNTER — Inpatient Hospital Stay: Payer: 59 | Admitting: Oncology

## 2019-01-15 ENCOUNTER — Telehealth: Payer: Self-pay | Admitting: *Deleted

## 2019-01-15 NOTE — Telephone Encounter (Signed)
Patient had called and does not want to come in with the possibility of coronavirus. She has chosen to make appt further out. I asked if she wants 1 month or 2 months. She prefers to make it Mid May. I made appt 5/15 with1:15 lab and see md 1:45. If she starts feeling what she calls bottomed out she will call sooner and if May comes and the virus is still going on then she knows to call and cancel or r/s it near the appt time

## 2019-02-19 ENCOUNTER — Telehealth: Payer: Self-pay | Admitting: Oncology

## 2019-02-19 ENCOUNTER — Other Ambulatory Visit: Payer: Self-pay

## 2019-02-19 ENCOUNTER — Other Ambulatory Visit: Payer: 59

## 2019-02-19 ENCOUNTER — Other Ambulatory Visit: Payer: Self-pay | Admitting: *Deleted

## 2019-02-19 ENCOUNTER — Inpatient Hospital Stay: Payer: 59 | Attending: Hematology and Oncology

## 2019-02-19 DIAGNOSIS — D509 Iron deficiency anemia, unspecified: Secondary | ICD-10-CM | POA: Diagnosis present

## 2019-02-19 LAB — CBC
HCT: 41 % (ref 36.0–46.0)
Hemoglobin: 13.7 g/dL (ref 12.0–15.0)
MCH: 30 pg (ref 26.0–34.0)
MCHC: 33.4 g/dL (ref 30.0–36.0)
MCV: 89.7 fL (ref 80.0–100.0)
Platelets: 421 10*3/uL — ABNORMAL HIGH (ref 150–400)
RBC: 4.57 MIL/uL (ref 3.87–5.11)
RDW: 15.5 % (ref 11.5–15.5)
WBC: 9.5 10*3/uL (ref 4.0–10.5)
nRBC: 0 % (ref 0.0–0.2)

## 2019-02-19 LAB — IRON AND TIBC
Iron: 48 ug/dL (ref 28–170)
Saturation Ratios: 13 % (ref 10.4–31.8)
TIBC: 383 ug/dL (ref 250–450)
UIBC: 335 ug/dL

## 2019-02-19 LAB — FERRITIN: Ferritin: 111 ng/mL (ref 11–307)

## 2019-02-20 ENCOUNTER — Encounter: Payer: Self-pay | Admitting: Oncology

## 2019-02-20 ENCOUNTER — Inpatient Hospital Stay (HOSPITAL_BASED_OUTPATIENT_CLINIC_OR_DEPARTMENT_OTHER): Payer: 59 | Admitting: Oncology

## 2019-02-20 ENCOUNTER — Other Ambulatory Visit: Payer: 59

## 2019-02-20 DIAGNOSIS — F17210 Nicotine dependence, cigarettes, uncomplicated: Secondary | ICD-10-CM

## 2019-02-20 DIAGNOSIS — I10 Essential (primary) hypertension: Secondary | ICD-10-CM

## 2019-02-20 DIAGNOSIS — D509 Iron deficiency anemia, unspecified: Secondary | ICD-10-CM | POA: Diagnosis not present

## 2019-02-20 DIAGNOSIS — Z79899 Other long term (current) drug therapy: Secondary | ICD-10-CM

## 2019-02-20 NOTE — Progress Notes (Signed)
Pt walking 3-4 times a day. Inc. Smoking to 1 PPD. Anemia- tired more than usual. She is pushing herself but she stays  At home due to covid virus. She rests at home. No appetite.

## 2019-02-25 NOTE — Progress Notes (Signed)
I connected with Jacqueline Harmon on 02/25/19 at  1:45 PM EDT by video enabled telemedicine visit and verified that I am speaking with the correct person using two identifiers.   I discussed the limitations, risks, security and privacy concerns of performing an evaluation and management service by telemedicine and the availability of in-person appointments. I also discussed with the patient that there may be a patient responsible charge related to this service. The patient expressed understanding and agreed to proceed.  Other persons participating in the visit and their role in the encounter:  none  Patient's location:  home Provider's location:  work  Risk analyst Complaint:  Iron deficiency anemia  History of present illness:  patient is a 63 year old African-American postmenopausal female who has been referred to [place] for iron deficiency anemia.Most recent CBC from 05/05/2018 showed white count of 9.9, H&H of 9.7/31.1 with an MCV of 68.6 and a platelet count of 583. Iron studies from April 2019 showed a low ferritin of 88 and iron studies which showed a low iron saturation of 4% and elevated TIBC of 463. On looking back at her prior CBCs patient had a normal hemoglobin between 12-13 up until September 2018. Since October 2018 her H&H has been gradually drifting down from 11-9 over the last 1 year. She did see Dr. Allen Norris from GI and underwent EGD as well as colonoscopy. EGD showed gastritis with evidence of gastric metaplasia and colonoscopy showed polyp which was removed and showed no evidence of malignancy. There was also localized moderate inflammation found at the ileocecal valve which was biopsied and increased mucosa vascular pattern in the entire examined colon.  Patient reports that she took oral iron for 2 months but stopped taking it about 3 months ago due to nausea. She denies any consistent use of NSAIDs. She denies any blood in her stool or urine or dark tarry stools. Denies any family  history of colon cancer. Reports 14 to 15 pound weight loss over the last 1 year.  Results of blood work to 05/12/2018 were as follows: CBC showed white count of 10, H&H of 9.6/30.3 with an MCV of 68.9 and a platelet count of 578.  Ferritin levels were low at 4.  Iron studies showed elevated TIBC of 532 and low iron saturation of 3%.  B12 level was normal at 312.  Folate was normal at 10.5.  Celiac disease panel was negative.  Urinalysis did not reveal any hematuria.  Patient received 2 doses of Feraheme in August 2019.   Interval history reports ongoing fatigue. Appetite is fair. She has not lost any weight unintentionally.    Review of Systems  Constitutional: Positive for malaise/fatigue. Negative for chills, fever and weight loss.  HENT: Negative for congestion, ear discharge and nosebleeds.   Eyes: Negative for blurred vision.  Respiratory: Negative for cough, hemoptysis, sputum production, shortness of breath and wheezing.   Cardiovascular: Negative for chest pain, palpitations, orthopnea and claudication.  Gastrointestinal: Negative for abdominal pain, blood in stool, constipation, diarrhea, heartburn, melena, nausea and vomiting.  Genitourinary: Negative for dysuria, flank pain, frequency, hematuria and urgency.  Musculoskeletal: Negative for back pain, joint pain and myalgias.  Skin: Negative for rash.  Neurological: Negative for dizziness, tingling, focal weakness, seizures, weakness and headaches.  Endo/Heme/Allergies: Does not bruise/bleed easily.  Psychiatric/Behavioral: Negative for depression and suicidal ideas. The patient does not have insomnia.     Allergies  Allergen Reactions  . Levofloxacin Shortness Of Breath    myalgia, constipation  . Ace  Inhibitors Cough    One particular medication causes problems - maybe starts with letter L  . Budesonide Nausea And Vomiting  . Prednisone Itching    Past Medical History:  Diagnosis Date  . Anemia   . Bulging of lumbar  intervertebral disc   . Degenerative disc disease, cervical   . Headache    migraines - 1x/mo - better since topamax  . Hypercholesteremia   . Hypertension   . Migraines   . Multilevel degenerative disc disease   . Wears dentures    partial upper and lower    Past Surgical History:  Procedure Laterality Date  . ANTERIOR CERVICAL DISCECTOMY  2010  . BREAST BIOPSY Left 08/2014   papilloma  . COLONOSCOPY WITH PROPOFOL N/A 08/02/2016   Procedure: COLONOSCOPY WITH PROPOFOL;  Surgeon: [name], MD;  Location: [place];  Service: Endoscopy;  Laterality: N/A;  . COLONOSCOPY WITH PROPOFOL N/A 09/13/2017   Procedure: COLONOSCOPY WITH PROPOFOL;  Surgeon: [name], MD;  Location: [place];  Service: Endoscopy;  Laterality: N/A;  . ENDOSCOPIC HEMILAMINOTOMY W/ DISCECTOMY LUMBAR  2009, 2011  . ESOPHAGEAL DILATION  08/02/2016   Procedure: ESOPHAGEAL DILATION;  Surgeon: [name], MD;  Location: [place];  Service: Endoscopy;;  . ESOPHAGOGASTRODUODENOSCOPY (EGD) WITH PROPOFOL N/A 08/02/2016   Repeat EGD 08/2019. Performed by Dr. [name]  . ESOPHAGOGASTRODUODENOSCOPY (EGD) WITH PROPOFOL N/A 09/13/2017   Procedure: ESOPHAGOGASTRODUODENOSCOPY (EGD) WITH PROPOFOL;  Surgeon: [name], MD;  Location: [place];  Service: Endoscopy;  Laterality: N/A;  . ESOPHAGOGASTRODUODENOSCOPY (EGD) WITH PROPOFOL N/A 10/04/2017   Procedure: ESOPHAGOGASTRODUODENOSCOPY (EGD) WITH PROPOFOL;  Surgeon: [name], MD;  Location: [place];  Service: Endoscopy;  Laterality: N/A;  . POLYPECTOMY  09/13/2017   Procedure: POLYPECTOMY;  Surgeon: [name], MD;  Location: [place];  Service: Endoscopy;;    Social History   Socioeconomic History  . Marital status: Married    Spouse name: Not on file  . Number of children: Not on file  . Years of education: Not on file  . Highest education level: Not on file  Occupational History  .  Occupation: Radio broadcast assistant  Social Needs  . Financial resource strain: Not on file  . Food insecurity:    Worry: Not on file    Inability: Not on file  . Transportation needs:    Medical: Not on file    Non-medical: Not on file  Tobacco Use  . Smoking status: Current Every Day Smoker    Packs/day: 1.00    Years: 20.00    Pack years: 20.00    Types: Cigarettes  . Smokeless tobacco: Never Used  Substance and Sexual Activity  . Alcohol use: Not Currently    Alcohol/week: 0.0 standard drinks  . Drug use: No  . Sexual activity: Yes    Comment: not a lot  Lifestyle  . Physical activity:    Days per week: Not on file    Minutes per session: Not on file  . Stress: Not on file  Relationships  . Social connections:    Talks on phone: Not on file    Gets together: Not on file    Attends religious service: Not on file    Active member of club or organization: Not on file    Attends meetings of clubs or organizations: Not on file    Relationship status: Not on file  . Intimate partner violence:    Fear of current or ex partner:  Not on file    Emotionally abused: Not on file    Physically abused: Not on file    Forced sexual activity: Not on file  Other Topics Concern  . Not on file  Social History Narrative  . Not on file    Family History  Problem Relation Age of Onset  . Breast cancer Mother 50  . Diabetes Father   . Osteoarthritis Father   . Breast cancer Maternal Grandmother 90     Current Outpatient Medications:  .  albuterol (PROVENTIL HFA;VENTOLIN HFA) 108 (90 Base) MCG/ACT inhaler, Inhale 2 puffs into the lungs every 6 (six) hours as needed for wheezing or shortness of breath., Disp: 1 Inhaler, Rfl: 0 .  atorvastatin (LIPITOR) 10 MG tablet, TAKE 1 TABLET BY MOUTH EVERY DAY, Disp: 90 tablet, Rfl: 2 .  Butalbital-APAP-Caffeine 50-300-40 MG CAPS, Take 1 capsule by mouth 2 (two) times daily as needed. migraine, Disp: 60 capsule, Rfl: 0 .  DEXILANT 60 MG capsule, TAKE 1  CAPSULE BY MOUTH EVERY DAY (Patient taking differently: 60 mg daily as needed. ), Disp: 90 capsule, Rfl: 3 .  irbesartan (AVAPRO) 150 MG tablet, TAKE 1 TABLET BY MOUTH EVERY DAY, Disp: 90 tablet, Rfl: 1 .  promethazine (PHENERGAN) 25 MG tablet, Take 1 tablet (25 mg total) by mouth every 6 (six) hours as needed for nausea or vomiting., Disp: 30 tablet, Rfl: 3 .  spironolactone (ALDACTONE) 50 MG tablet, TAKE 1 TABLET BY MOUTH EVERY DAY, Disp: 90 tablet, Rfl: 2 .  topiramate (TOPAMAX) 25 MG tablet, TAKE 2 TABLETS (50 MG TOTAL) BY MOUTH AT BEDTIME., Disp: 180 tablet, Rfl: 3  No results found.  No images are attached to the encounter.   CMP Latest Ref Rng & Units 06/17/2018  Glucose 65 - 99 mg/dL 45(L)  BUN 8 - 27 mg/dL 12  Creatinine 0.57 - 1.00 mg/dL 1.13(H)  Sodium 134 - 144 mmol/L 143  Potassium 3.5 - 5.2 mmol/L 4.6  Chloride 96 - 106 mmol/L 106  CO2 20 - 29 mmol/L 21  Calcium 8.7 - 10.3 mg/dL 9.8  Total Protein 6.0 - 8.5 g/dL 7.1  Total Bilirubin 0.0 - 1.2 mg/dL <0.2  Alkaline Phos 39 - 117 IU/L 111  AST 0 - 40 IU/L 18  ALT 0 - 32 IU/L 19   CBC Latest Ref Rng & Units 02/19/2019  WBC 4.0 - 10.5 K/uL 9.5  Hemoglobin 12.0 - 15.0 g/dL 13.7  Hematocrit 36.0 - 46.0 % 41.0  Platelets 150 - 400 K/uL 421(H)     Observation/objective:patient appears in no acute distress over video visit today. Breathing is no labored.   Assessment and plan:patient is a 63 yr old female with h/o iron deficiency anemia and this is a routine f/u visit.   Patient is not anemic at this time. Iron studies are within normal limits.   Follow-up instructions: repeat cbc ferritin and iron studies in 3 and 6 months. I will see her back in 6 months  I discussed the assessment and treatment plan with the patient. The patient was provided an opportunity to ask questions and all were answered. The patient agreed with the plan and demonstrated an understanding of the instructions.   The patient was advised to call  back or seek an in-person evaluation if the symptoms worsen or if the condition fails to improve as anticipated.   Visit Diagnosis: 1. Iron deficiency anemia, unspecified iron deficiency anemia type     Dr. Randa Evens, MD, MPH  [facility] [phone] 02/25/2019 6:42 PM

## 2019-03-05 ENCOUNTER — Other Ambulatory Visit: Payer: Self-pay | Admitting: *Deleted

## 2019-03-05 DIAGNOSIS — D509 Iron deficiency anemia, unspecified: Secondary | ICD-10-CM

## 2019-03-07 ENCOUNTER — Other Ambulatory Visit: Payer: Self-pay | Admitting: Internal Medicine

## 2019-03-07 DIAGNOSIS — I10 Essential (primary) hypertension: Secondary | ICD-10-CM

## 2019-06-02 IMAGING — MG 2D DIGITAL DIAGNOSTIC BILATERAL MAMMOGRAM WITH CAD AND ADJUNCT T
8 of 12 series · 8 of 28 positions shown · non-contrast
Comparison: Previous exam(s).

CLINICAL DATA: The patient had a biopsy of left breast which showed
papilloma in 3555. The patient elected not to have surgical removal.

EXAM:
2D DIGITAL DIAGNOSTIC BILATERAL MAMMOGRAM WITH CAD AND ADJUNCT TOMO

[R MLO]
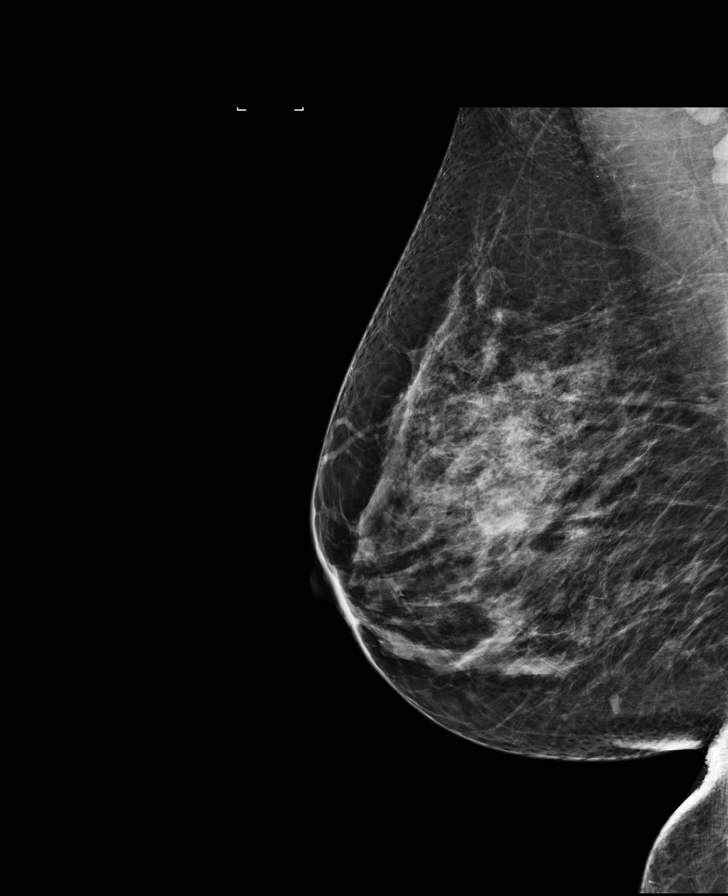

[L MLO]
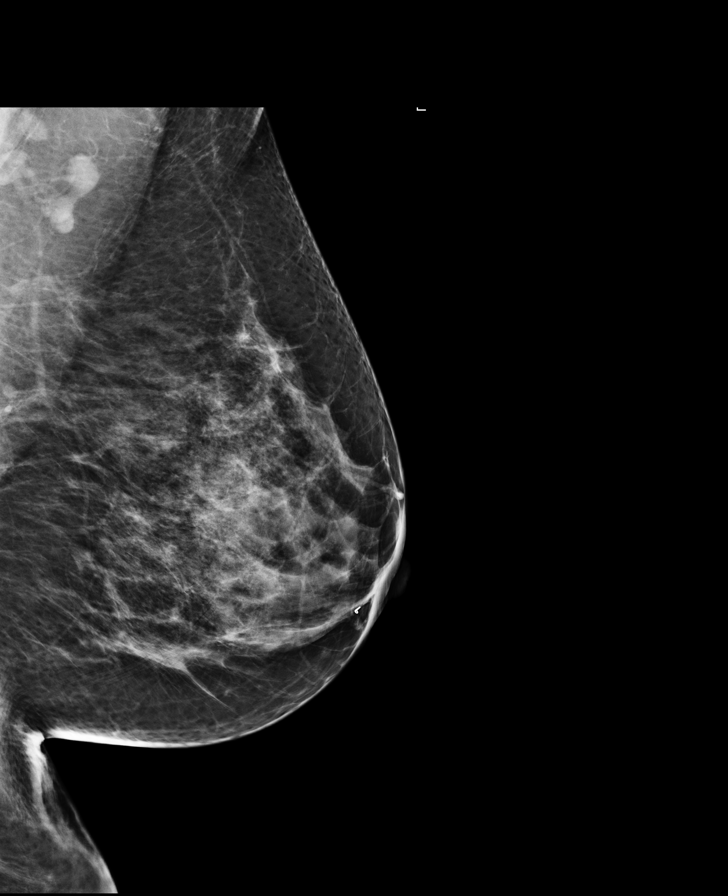

[L CC]
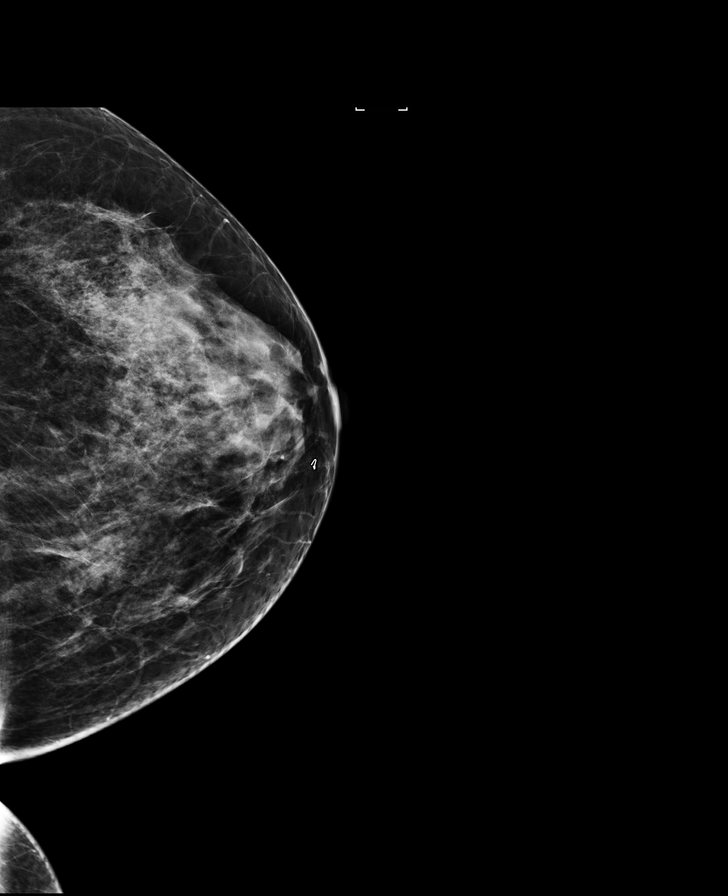

[R MLO synth-2D]
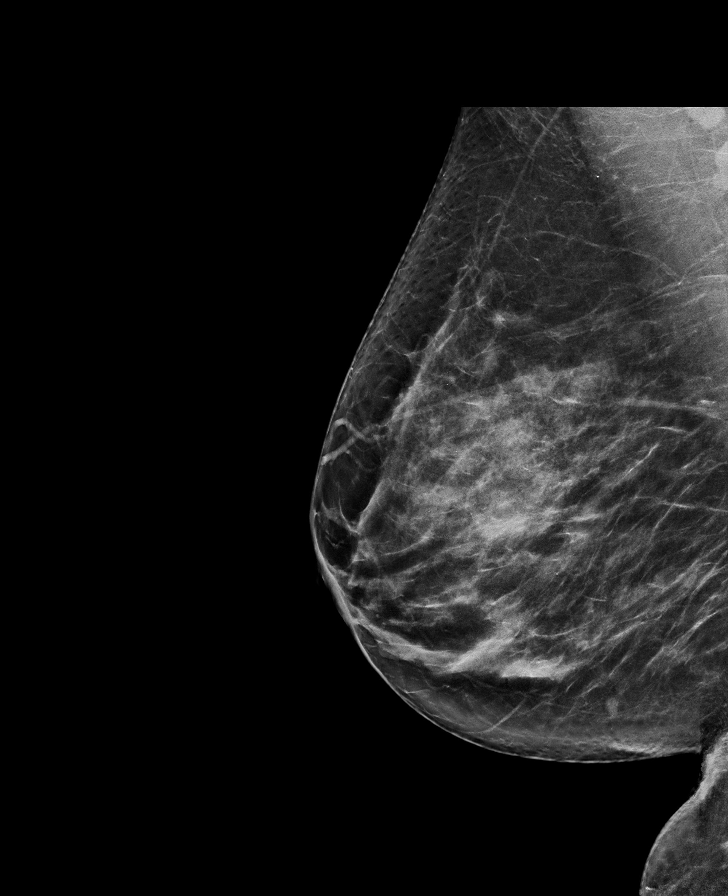

[L CC synth-2D]
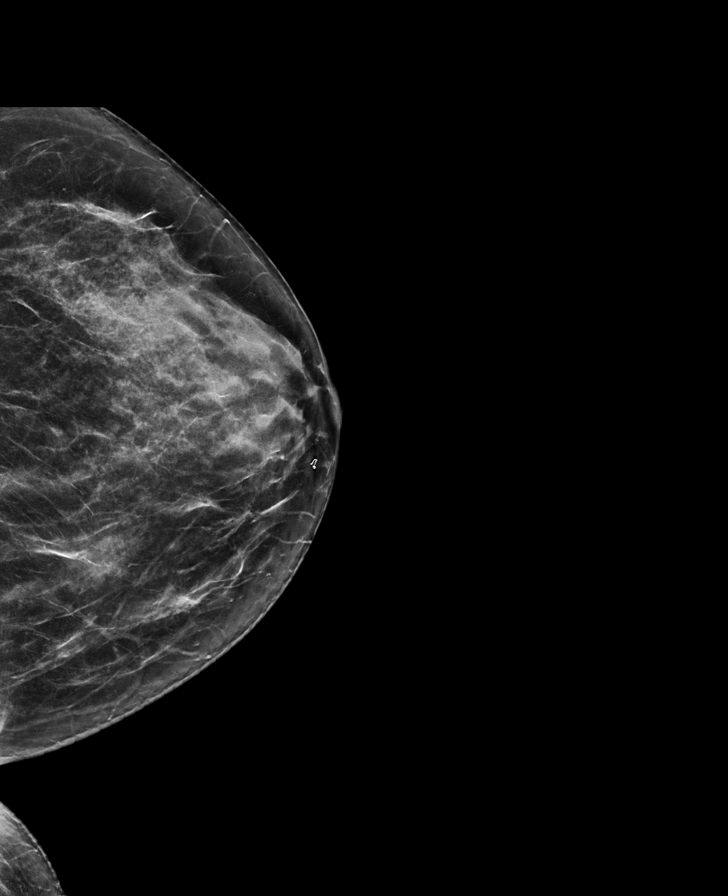

[R CC synth-2D]
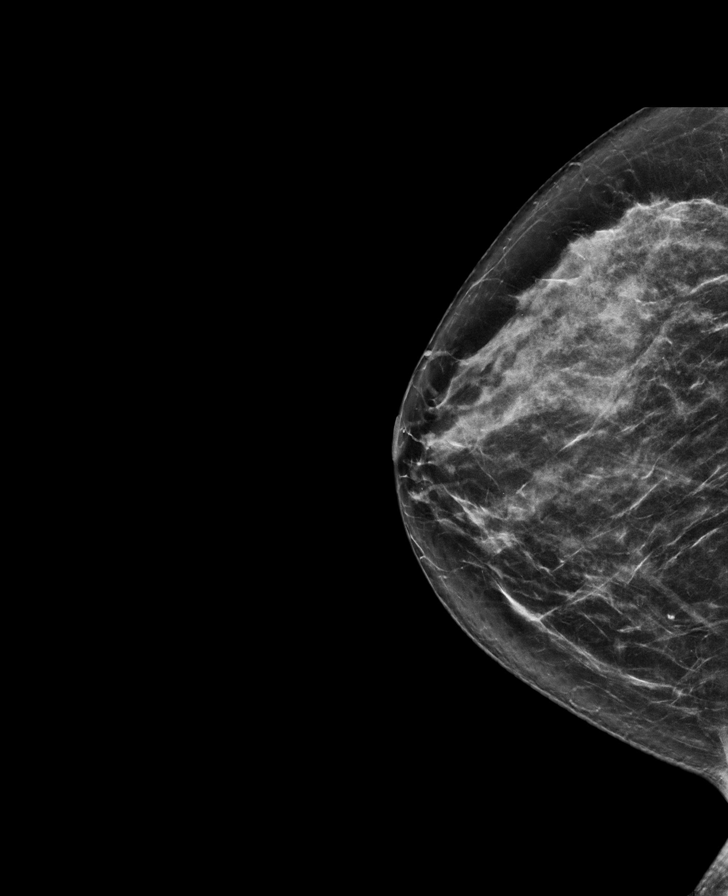

[R CC]
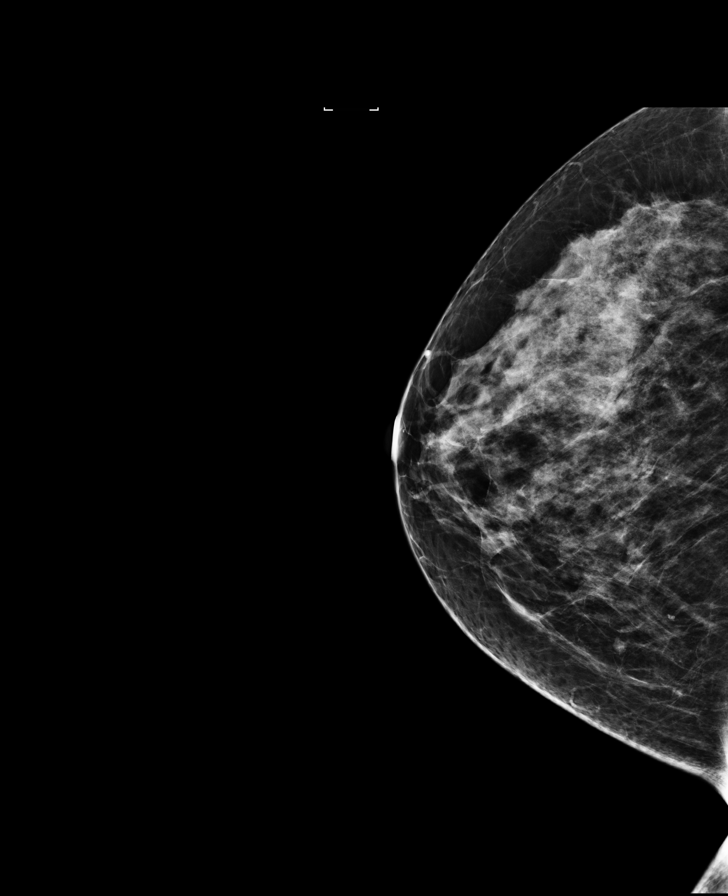

[L MLO synth-2D]
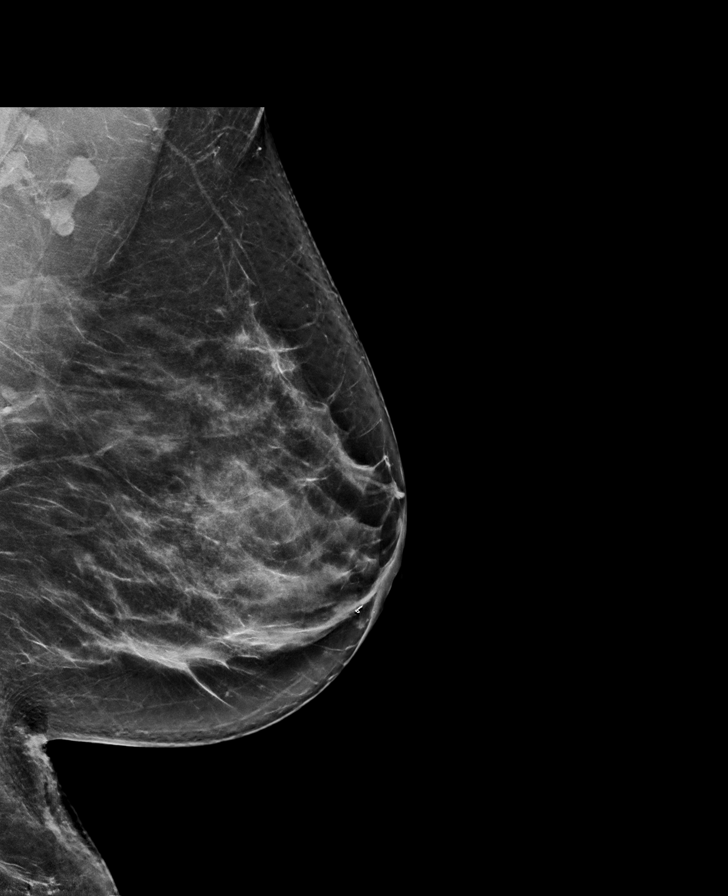

[8 of 28 positions shown; findings below may reference images not displayed]

ACR Breast Density Category c: The breast tissue is heterogeneously
dense, which may obscure small masses.
FINDINGS: CC and MLO views of bilateral breasts are submitted. No suspicious
abnormalities identified bilaterally. Biopsy clip is identified in
the retroareolar left breast unchanged.

Mammographic images were processed with CAD.
IMPRESSION: Benign findings.

RECOMMENDATION:
Routine screening mammogram in 1 year.

I have discussed the findings and recommendations with the patient.
Results were also provided in writing at the conclusion of the
visit. If applicable, a reminder letter will be sent to the patient
regarding the next appointment.

BI-RADS CATEGORY  2: Benign.

## 2019-06-04 ENCOUNTER — Other Ambulatory Visit: Payer: Self-pay

## 2019-06-04 ENCOUNTER — Inpatient Hospital Stay: Payer: 59 | Attending: Oncology

## 2019-06-04 ENCOUNTER — Inpatient Hospital Stay: Payer: 59 | Admitting: Oncology

## 2019-06-04 DIAGNOSIS — D509 Iron deficiency anemia, unspecified: Secondary | ICD-10-CM | POA: Insufficient documentation

## 2019-06-04 LAB — CBC WITH DIFFERENTIAL/PLATELET
Abs Immature Granulocytes: 0.02 10*3/uL (ref 0.00–0.07)
Basophils Absolute: 0.1 10*3/uL (ref 0.0–0.1)
Basophils Relative: 1 %
Eosinophils Absolute: 0.2 10*3/uL (ref 0.0–0.5)
Eosinophils Relative: 2 %
HCT: 38.2 % (ref 36.0–46.0)
Hemoglobin: 12.4 g/dL (ref 12.0–15.0)
Immature Granulocytes: 0 %
Lymphocytes Relative: 37 %
Lymphs Abs: 3.8 10*3/uL (ref 0.7–4.0)
MCH: 28.5 pg (ref 26.0–34.0)
MCHC: 32.5 g/dL (ref 30.0–36.0)
MCV: 87.8 fL (ref 80.0–100.0)
Monocytes Absolute: 0.9 10*3/uL (ref 0.1–1.0)
Monocytes Relative: 9 %
Neutro Abs: 5.5 10*3/uL (ref 1.7–7.7)
Neutrophils Relative %: 51 %
Platelets: 414 10*3/uL — ABNORMAL HIGH (ref 150–400)
RBC: 4.35 MIL/uL (ref 3.87–5.11)
RDW: 14.6 % (ref 11.5–15.5)
WBC: 10.5 10*3/uL (ref 4.0–10.5)
nRBC: 0 % (ref 0.0–0.2)

## 2019-06-04 LAB — VITAMIN B12: Vitamin B-12: 268 pg/mL (ref 180–914)

## 2019-06-04 LAB — FERRITIN: Ferritin: 38 ng/mL (ref 11–307)

## 2019-06-04 LAB — IRON AND TIBC
Iron: 41 ug/dL (ref 28–170)
Saturation Ratios: 10 % — ABNORMAL LOW (ref 10.4–31.8)
TIBC: 423 ug/dL (ref 250–450)
UIBC: 382 ug/dL

## 2019-06-08 NOTE — Progress Notes (Signed)
I called pt. This evening at 5:30 and got her voice mail and left message that her hgb is normal but her ferritin-iron stores are low- leass than 50 and dr. Janese Banks is recommending that she get IV iron. I left my direct number to call me back with her decision of what she would like to do

## 2019-06-11 NOTE — Progress Notes (Signed)
I had called pt. And left message about her results and Janese Banks wanted her to have 2 more doses. The pt. Called back today and said that when the covid virus started she stayed home and ate better and the levels were good. Now she is back to bad eating and number back down. She will work on eating better and she is agreeable to 2 doses of feraheme. I told her that it will be there the 9/14 week before she is scheduled due to the holiday next week the infusion schedule is full. She was agreeable. I told her that scheduler will call her with appts. And I sent message to lorena

## 2019-06-15 ENCOUNTER — Other Ambulatory Visit: Payer: Self-pay | Admitting: Oncology

## 2019-06-18 ENCOUNTER — Other Ambulatory Visit: Payer: Self-pay | Admitting: Internal Medicine

## 2019-06-19 ENCOUNTER — Encounter: Payer: 59 | Admitting: Internal Medicine

## 2019-06-22 ENCOUNTER — Other Ambulatory Visit: Payer: Self-pay | Admitting: Oncology

## 2019-06-22 ENCOUNTER — Other Ambulatory Visit: Payer: Self-pay

## 2019-06-22 ENCOUNTER — Telehealth: Payer: Self-pay | Admitting: *Deleted

## 2019-06-22 NOTE — Telephone Encounter (Signed)
Called patient to explain the reason why she has 5 visits for iron rather than the 2 visits of iron that we spoke about previously.  I told her that we mostly use the drug called Feraheme-it is a 2 dose over 1 week and we use that mostly for patient convenience, so they do not have to come as often.  When our insurance people check the benefits the insurance will only approve Venofer or ferrlecit.  The Venofer takes 5 doses total in the Ferralecit takes at least 6 doses to get the complete amount of iron.  Patient aware that as long as it works the same way she will be okay with the taken the Venofer.  Ointments have been scheduled already and she has a my chart that she looks at her visits for

## 2019-06-23 ENCOUNTER — Inpatient Hospital Stay: Payer: 59 | Attending: Oncology

## 2019-06-23 ENCOUNTER — Other Ambulatory Visit: Payer: Self-pay

## 2019-06-23 VITALS — BP 151/80 | HR 76 | Resp 17

## 2019-06-23 DIAGNOSIS — D509 Iron deficiency anemia, unspecified: Secondary | ICD-10-CM | POA: Insufficient documentation

## 2019-06-23 MED ORDER — IRON SUCROSE 20 MG/ML IV SOLN
200.0000 mg | Freq: Once | INTRAVENOUS | Status: AC
  Administered 2019-06-23: 14:00:00 200 mg via INTRAVENOUS
  Filled 2019-06-23: qty 10

## 2019-06-23 MED ORDER — SODIUM CHLORIDE 0.9 % IV SOLN: 200.0000 mg | Freq: Once | INTRAVENOUS | Status: DC

## 2019-06-23 MED ORDER — SODIUM CHLORIDE 0.9 % IV SOLN
Freq: Once | INTRAVENOUS | Status: AC
  Administered 2019-06-23: 14:00:00 via INTRAVENOUS
  Filled 2019-06-23: qty 250

## 2019-06-26 ENCOUNTER — Inpatient Hospital Stay: Payer: 59

## 2019-06-29 ENCOUNTER — Other Ambulatory Visit: Payer: Self-pay

## 2019-06-30 ENCOUNTER — Inpatient Hospital Stay: Payer: 59

## 2019-07-03 ENCOUNTER — Inpatient Hospital Stay: Payer: 59

## 2019-07-07 ENCOUNTER — Inpatient Hospital Stay: Payer: 59

## 2019-07-15 ENCOUNTER — Encounter: Payer: 59 | Admitting: Internal Medicine

## 2019-07-15 ENCOUNTER — Telehealth: Payer: Self-pay

## 2019-07-15 NOTE — Progress Notes (Deleted)
Date:  07/15/2019   Name:  Jacqueline Harmon   DOB:  [birth year]   MRN:  [id number]   Chief Complaint: No chief complaint on file. Jacqueline Harmon is a 63 y.o. female who presents today for her Complete Annual Exam. She feels {DESC; WELL/FAIRLY WELL/POORLY:18703}. She reports exercising ***. She reports she is sleeping {DESC; WELL/FAIRLY WELL/POORLY:18703}.   Mammogram 08/2018 Colonoscopy 09/2017 Pap 2017 thin prep  Hypertension This is a chronic problem. The problem is controlled. Past treatments include angiotensin blockers.  Gastroesophageal Reflux She complains of heartburn. This is a recurrent problem. She has tried a PPI for the symptoms. The treatment provided significant relief.  Migraine  Her past medical history is significant for hypertension.   Lab Results  Component Value Date   HGBA1C 5.8 (H) 05/05/2018   Lab Results  Component Value Date   CREATININE 1.13 (H) 06/17/2018   BUN 12 06/17/2018   NA 143 06/17/2018   K 4.6 06/17/2018   CL 106 06/17/2018   CO2 21 06/17/2018   Lab Results  Component Value Date   CHOL 160 06/17/2018   HDL 54 06/17/2018   LDLCALC 81 06/17/2018   TRIG 127 06/17/2018   CHOLHDL 3.0 06/17/2018     Review of Systems  Gastrointestinal: Positive for heartburn.    Patient Active Problem List   Diagnosis Date Noted  . Atrophic gastritis without hemorrhage   . Iron deficiency anemia   . Abdominal pain, generalized   . Benign neoplasm of ascending colon   . Gastroesophageal reflux disease with esophagitis 06/14/2017  . Intestinal metaplasia of gastric mucosa 02/11/2017  . Gastritis without bleeding   . Adenomatous colon polyp   . Mixed hyperlipidemia 06/10/2015  . Smokes tobacco daily 03/23/2015  . Essential (primary) hypertension 03/23/2015  . Intraductal papilloma of breast, left 03/23/2015  . Hot flash, menopausal 03/23/2015  . Migraine without aura and responsive to treatment 03/23/2015  . 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 Levofloxacin Shortness Of Breath    myalgia, constipation  . Ace Inhibitors Cough    One particular medication causes problems - maybe starts with letter L  . Budesonide Nausea And Vomiting  . Prednisone Itching    Past Surgical History:  Procedure Laterality Date  . ANTERIOR CERVICAL DISCECTOMY  2010  . BREAST BIOPSY Left 08/2014   papilloma  . COLONOSCOPY WITH PROPOFOL N/A 08/02/2016   Procedure: COLONOSCOPY WITH PROPOFOL;  Surgeon: [name], MD;  Location: [place];  Service: Endoscopy;  Laterality: N/A;  . COLONOSCOPY WITH PROPOFOL N/A 09/13/2017   Procedure: COLONOSCOPY WITH PROPOFOL;  Surgeon: [name], MD;  Location: [place];  Service: Endoscopy;  Laterality: N/A;  . ENDOSCOPIC HEMILAMINOTOMY W/ DISCECTOMY LUMBAR  2009, 2011  . ESOPHAGEAL DILATION  08/02/2016   Procedure: ESOPHAGEAL DILATION;  Surgeon: [name], MD;  Location: [place];  Service: Endoscopy;;  . ESOPHAGOGASTRODUODENOSCOPY (EGD) WITH PROPOFOL N/A 08/02/2016   Repeat EGD 08/2019. Performed by Dr. [name]  . ESOPHAGOGASTRODUODENOSCOPY (EGD) WITH PROPOFOL N/A 09/13/2017   Procedure: ESOPHAGOGASTRODUODENOSCOPY (EGD) WITH PROPOFOL;  Surgeon: [name], MD;  Location: [place];  Service: Endoscopy;  Laterality: N/A;  . ESOPHAGOGASTRODUODENOSCOPY (EGD) WITH PROPOFOL N/A 10/04/2017   Procedure: ESOPHAGOGASTRODUODENOSCOPY (EGD) WITH PROPOFOL;  Surgeon: [name], MD;  Location: [place];  Service: Endoscopy;  Laterality: N/A;  . POLYPECTOMY  09/13/2017   Procedure: POLYPECTOMY;  Surgeon: [name], MD;  Location: [place]  [facility];  Service: Endoscopy;;    Social History   Tobacco Use  . Smoking status: Current Every Day Smoker    Packs/day: 1.00    Years: 20.00    Pack years: 20.00    Types: Cigarettes  . Smokeless tobacco: Never Used  Substance Use Topics  . Alcohol use: Not Currently     Alcohol/week: 0.0 standard drinks  . Drug use: No     Medication list has been reviewed and updated.  No outpatient medications have been marked as taking for the 07/15/19 encounter (Appointment) with Glean Hess, MD.    [facility] 2/9 Scores 06/17/2018 05/05/2018 06/14/2017 06/12/2016  PHQ - 2 Score 0 0 0 0    BP Readings from Last 3 Encounters:  06/23/19 (!) 151/80  11/10/18 134/79  10/30/18 (!) 147/81    Physical Exam  Wt Readings from Last 3 Encounters:  07/07/18 170 lb 8.4 oz (77.3 kg)  06/17/18 172 lb (78 kg)  05/12/18 176 lb 12.9 oz (80.2 kg)    There were no vitals taken for this visit.  Assessment and Plan:

## 2019-07-15 NOTE — Telephone Encounter (Signed)
Patient walked in the clinic today 13 mins late for her appt. Informed by the front desk that she needed to reschedule and that we had already started on the next patient. Pt seemed upset, and walked out stating " I will find another doctor then."  Did not reschedule appt today.

## 2019-07-27 ENCOUNTER — Telehealth: Payer: Self-pay

## 2019-07-27 NOTE — Telephone Encounter (Signed)
Advised and informed Dr Army Melia

## 2019-07-27 NOTE — Telephone Encounter (Signed)
Pt called in response to no show letter received & wanted to let the office manager know she didn't no show.  She stated she arrived to her 8am appt at 8:10 to which I explained that she did not show up on time to her appt, she responded that it was unacceptable to refuse to see her even if she is late.  She stated that in these times we should be seeing patients regardless of what time they arrive.  I explained to her that providers have a schedule to keep to which is why we make appointments.  The pt stated it was unacceptable & that she will be letting Dr Army Melia know she is unhappy & will be finding another provider.

## 2019-08-21 ENCOUNTER — Other Ambulatory Visit: Payer: Self-pay

## 2019-08-21 NOTE — Progress Notes (Unsigned)
Patient pre screened for office appointment, no questions or concerns today. Patient reminded of upcoming appointment time and date. 

## 2019-08-24 ENCOUNTER — Inpatient Hospital Stay: Payer: 59 | Admitting: Oncology

## 2019-08-24 ENCOUNTER — Inpatient Hospital Stay: Payer: 59 | Attending: Oncology

## 2019-09-22 ENCOUNTER — Other Ambulatory Visit: Payer: Self-pay | Admitting: Internal Medicine

## 2019-09-25 ENCOUNTER — Other Ambulatory Visit: Payer: Self-pay | Admitting: Internal Medicine

## 2019-09-25 ENCOUNTER — Telehealth: Payer: Self-pay

## 2019-09-25 DIAGNOSIS — I10 Essential (primary) hypertension: Secondary | ICD-10-CM

## 2019-09-25 NOTE — Telephone Encounter (Signed)
Called [facility] and canceled RFs for spironolactone. Last time patient was here she said she will be finding a new PCP.  Canceled per Dr Army Melia.  Benedict Needy, CMA
